# Patient Record
Sex: Female | Born: 1975 | Race: Black or African American | Hispanic: No | Marital: Single | Smoking: Current some day smoker
Health system: Southern US, Community
[De-identification: ages and names within clinical notes are randomized; demographics above are authoritative.]

## PROBLEM LIST (undated history)

## (undated) DIAGNOSIS — Z969 Presence of functional implant, unspecified: Secondary | ICD-10-CM

## (undated) DIAGNOSIS — I10 Essential (primary) hypertension: Secondary | ICD-10-CM

---

## 2017-03-14 ENCOUNTER — Emergency Department (HOSPITAL_BASED_OUTPATIENT_CLINIC_OR_DEPARTMENT_OTHER): Payer: Medicaid - Out of State

## 2017-03-14 ENCOUNTER — Other Ambulatory Visit: Payer: Self-pay

## 2017-03-14 ENCOUNTER — Encounter (HOSPITAL_BASED_OUTPATIENT_CLINIC_OR_DEPARTMENT_OTHER): Payer: Self-pay | Admitting: *Deleted

## 2017-03-14 ENCOUNTER — Emergency Department (HOSPITAL_BASED_OUTPATIENT_CLINIC_OR_DEPARTMENT_OTHER)
Admission: EM | Admit: 2017-03-14 | Discharge: 2017-03-14 | Disposition: A | Discharge status: Home / Self Care | Payer: Medicaid - Out of State | Attending: Emergency Medicine | Admitting: Emergency Medicine

## 2017-03-14 DIAGNOSIS — X58XXXA Exposure to other specified factors, initial encounter: Secondary | ICD-10-CM | POA: Insufficient documentation

## 2017-03-14 DIAGNOSIS — S99911A Unspecified injury of right ankle, initial encounter: Secondary | ICD-10-CM | POA: Diagnosis present

## 2017-03-14 DIAGNOSIS — Y92812 Truck as the place of occurrence of the external cause: Secondary | ICD-10-CM | POA: Insufficient documentation

## 2017-03-14 DIAGNOSIS — Y9389 Activity, other specified: Secondary | ICD-10-CM | POA: Insufficient documentation

## 2017-03-14 DIAGNOSIS — S8261XA Displaced fracture of lateral malleolus of right fibula, initial encounter for closed fracture: Secondary | ICD-10-CM | POA: Insufficient documentation

## 2017-03-14 DIAGNOSIS — S82891A Other fracture of right lower leg, initial encounter for closed fracture: Secondary | ICD-10-CM

## 2017-03-14 DIAGNOSIS — I10 Essential (primary) hypertension: Secondary | ICD-10-CM | POA: Insufficient documentation

## 2017-03-14 DIAGNOSIS — Y999 Unspecified external cause status: Secondary | ICD-10-CM | POA: Insufficient documentation

## 2017-03-14 HISTORY — DX: Essential (primary) hypertension: I10

## 2017-03-14 LAB — PREGNANCY, URINE: Preg Test, Ur: NEGATIVE

## 2017-03-14 MED ORDER — OXYCODONE-ACETAMINOPHEN 5-325 MG PO TABS: 1.0000 | ORAL_TABLET | Freq: Three times a day (TID) | ORAL | 0 refills | Status: DC | PRN

## 2017-03-14 MED ORDER — MORPHINE SULFATE (PF) 4 MG/ML IV SOLN
4.0000 mg | Freq: Once | INTRAVENOUS | Status: AC
  Administered 2017-03-14: 4 mg via INTRAVENOUS
  Filled 2017-03-14: qty 1

## 2017-03-14 MED ORDER — ONDANSETRON HCL 4 MG/2ML IJ SOLN
4.0000 mg | Freq: Once | INTRAMUSCULAR | Status: AC
  Administered 2017-03-14: 4 mg via INTRAVENOUS
  Filled 2017-03-14: qty 2

## 2017-03-14 MED ORDER — DOCUSATE SODIUM 100 MG PO CAPS: 100.0000 mg | ORAL_CAPSULE | Freq: Two times a day (BID) | ORAL | 0 refills | Status: DC

## 2017-03-14 MED ORDER — KETAMINE HCL 10 MG/ML IJ SOLN
90.0000 mg | Freq: Once | INTRAMUSCULAR | Status: DC
  Filled 2017-03-14: qty 1

## 2017-03-14 MED ORDER — FENTANYL CITRATE (PF) 100 MCG/2ML IJ SOLN
50.0000 ug | Freq: Once | INTRAMUSCULAR | Status: AC
Start: 2017-03-14 — End: 2017-03-14
  Administered 2017-03-14: 50 ug via INTRAVENOUS
  Filled 2017-03-14: qty 2

## 2017-03-14 MED ORDER — KETAMINE HCL 10 MG/ML IJ SOLN
INTRAMUSCULAR | Status: AC | PRN
  Administered 2017-03-14: 30 mg via INTRAVENOUS
  Administered 2017-03-14: 90 mg via INTRAVENOUS

## 2017-03-14 NOTE — ED Notes (Signed)
O2 sats noted to drop into the 80s (84-88%) with good pleth; periodically; resp even/unlabored and normal rate; O2 sats increase with repositioning of the hand that has pulse oximeter and encouraging pt to take deep breaths.

## 2017-03-14 NOTE — ED Provider Notes (Signed)
Emergency Department Provider Note   I have reviewed the triage vital signs and the nursing notes.   HISTORY  Chief Complaint ankle injury   HPI Kathy Hogan is a 41 y.o. female with past medical history of hypertension presents to the emergency department for evaluation of right ankle pain.  Patient states she was stepping down out of a truck when her ankle rolled to the side.  She had severe pain in that area and noticed immediate swelling.  No numbness or tingling.  She removed her shoe and her friends encouraged her to presents emergency department.  She has severe pain with attempting to bear weight on the foot.  No fall or head injury.  No knee pain, hip pain, pain in the arms or legs.  Pain is severe and worse with movement.  No radiation of symptoms.   Past Medical History:  Diagnosis Date  . Hypertension     There are no active problems to display for this patient.   Past Surgical History:  Procedure Laterality Date  . CESAREAN SECTION        Allergies Patient has no known allergies.  No family history on file.  Social History Social History   Tobacco Use  . Smoking status: Never Smoker  . Smokeless tobacco: Never Used  Substance Use Topics  . Alcohol use: Yes    Frequency: Never  . Drug use: No    Review of Systems  Constitutional: No fever/chills Eyes: No visual changes. ENT: No sore throat. Cardiovascular: Denies chest pain. Respiratory: Denies shortness of breath. Gastrointestinal: No abdominal pain.  No nausea, no vomiting.  No diarrhea.  No constipation. Genitourinary: Negative for dysuria. Musculoskeletal: Negative for back pain. Positive right ankle pain and swelling.  Skin: Negative for rash. Neurological: Negative for headaches, focal weakness or numbness.  10-point ROS otherwise negative.  ____________________________________________   PHYSICAL EXAM:  VITAL SIGNS: Vitals:   03/14/17 0630 03/14/17 0645  BP: 133/78 124/72    Pulse: 77 95  SpO2: 93% 93%    Constitutional: Alert and oriented. Well appearing and in no acute distress. Eyes: Conjunctivae are normal. Head: Atraumatic. Nose: No congestion/rhinnorhea. Mouth/Throat: Mucous membranes are moist.  Oropharynx non-erythematous. Neck: No stridor.   Cardiovascular: Normal rate, regular rhythm. Good peripheral circulation. Grossly normal heart sounds.   Respiratory: Normal respiratory effort.  No retractions. Lungs CTAB. Gastrointestinal: Soft and nontender. No distention.  Musculoskeletal: Right ankle swelling with tenderness to palpation over the medial malleolus. Some mild proximal fibula tenderness. No evidence of open fracture/dislocation. DP and PT pulses intact.  Neurologic:  Normal speech and language. No gross focal neurologic deficits are appreciated.  Skin:  Skin is warm, dry and intact. No rash noted.  ____________________________________________   LABS (all labs ordered are listed, but only abnormal results are displayed)  Labs Reviewed  PREGNANCY, URINE   ____________________________________________  RADIOLOGY  Dg Ankle Complete Right  Result Date: 03/14/2017 CLINICAL DATA:  41 y/o  F; 41 y/o  F; ankle injury with pain. EXAM: RIGHT ANKLE - COMPLETE 3+ VIEW COMPARISON:  None. FINDINGS: Acute oblique fibular fracture superior tibial plafond with mild displacement and comminution. Probable avulsion fracture of the posterior-lateral tibial tubercle. Marked widening of medial ankle mortise. Talar dome appears intact. Dorsal calcaneal bone spur. Extensive soft tissue swelling about the ankle. IMPRESSION: 1. Comminuted oblique mildly displaced acute distal fibular fracture superior to tibial plafond. 2. Probable acute avulsion fracture of posterolateral tibial tubercle. 3. Marked widening of medial ankle mortise.  Electronically Signed   By: Mitzi Hansen M.D.   On: 03/14/2017 05:30   CT ankle:   IMPRESSION:  1. Oblique,  comminuted, mildly displaced fracture of the distal  fibula, extending into the distal syndesmosis, with widening of the  syndesmosis and medial clear space. Varus angulation of the talus  with respect to the tibia.  2. Small ossific densities along the posterior malleolus may  represent tiny avulsion fractures.  3. Nondisplaced fracture through the base of the second metatarsal.      Electronically Signed  By: Obie Dredge M.D.  On: 03/14/2017 10:35   Plain film right knee:   FINDINGS:  Mild tricompartmental degenerative changes are noted most marked in  the medial joint space. Mild spurring is noted at the superior  insertion of the medial collateral ligament. No joint effusion is  seen. No acute fracture is noted.    IMPRESSION:  No acute abnormality noted.      Electronically Signed  By: Alcide Clever M.D.  On: 03/14/2017 08:10    ____________________________________________   PROCEDURES  Procedure(s) performed:   .Sedation Date/Time: 03/14/2017 10:50 AM Performed by: Maia Plan, MD Authorized by: Maia Plan, MD   Consent:    Consent obtained:  Written   Consent given by:  Patient   Risks discussed:  Allergic reaction, dysrhythmia, inadequate sedation, nausea, vomiting, respiratory compromise necessitating ventilatory assistance and intubation, prolonged sedation necessitating reversal and prolonged hypoxia resulting in organ damage   Alternatives discussed:  Regional anesthesia Universal protocol:    Procedure explained and questions answered to patient or proxy's satisfaction: yes     Immediately prior to procedure a time out was called: yes     Patient identity confirmation method:  Anonymous protocol, patient vented/unresponsive and arm band Indications:    Procedure performed:  Dislocation reduction   Procedure necessitating sedation performed by:  Physician performing sedation   Intended level of sedation:  Moderate (conscious  sedation) Pre-sedation assessment:    Time since last food or drink:  8 hours   ASA classification: class 1 - normal, healthy patient     Mallampati score:  I - soft palate, uvula, fauces, pillars visible   Pre-sedation assessments completed and reviewed: airway patency, cardiovascular function, hydration status, mental status, nausea/vomiting, pain level, respiratory function and temperature   Immediate pre-procedure details:    Reviewed: vital signs     Verified: bag valve mask available, emergency equipment available, intubation equipment available, IV patency confirmed, oxygen available and suction available   Procedure details (see MAR for exact dosages):    Preoxygenation:  Nasal cannula   Sedation:  Ketamine   Analgesia:  Morphine   Intra-procedure monitoring:  Blood pressure monitoring, continuous capnometry, frequent LOC assessments, frequent vital sign checks, continuous pulse oximetry and cardiac monitor   Intra-procedure events: none     Total Provider sedation time (minutes):  30 Post-procedure details:    Attendance: Constant attendance by certified staff until patient recovered     Recovery: Patient returned to pre-procedure baseline     Post-sedation assessments completed and reviewed: airway patency, cardiovascular function, hydration status, mental status, nausea/vomiting, pain level, respiratory function and temperature     Patient is stable for discharge or admission: yes     Patient tolerance:  Tolerated well, no immediate complications Reduction of dislocation Date/Time: 03/14/2017 10:53 AM Performed by: Maia Plan, MD Authorized by: Maia Plan, MD  Consent: Written consent obtained. Risks and benefits: risks, benefits and alternatives  were discussed Consent given by: patient Imaging studies: imaging studies available Required items: required blood products, implants, devices, and special equipment available Patient identity confirmed: verbally with  patient and arm band Time out: Immediately prior to procedure a "time out" was called to verify the correct patient, procedure, equipment, support staff and site/side marked as required. Local anesthesia used: no  Anesthesia: Local anesthesia used: no  Sedation: Patient sedated: yes Sedation type: moderate (conscious) sedation Sedatives: ketamine Analgesia: morphine Sedation start date/time: 03/14/2017 9:21 AM Sedation end date/time: 03/14/2017 9:51 AM Vitals: Vital signs were monitored during sedation.  Patient tolerance: Patient tolerated the procedure well with no immediate complications Comments: Lateral pressure applied to the talus and patient held by the big toe during splinting. Mold applied with palms to the ankle with slight ankle eversion.     ____________________________________________   INITIAL IMPRESSION / ASSESSMENT AND PLAN / ED COURSE  Pertinent labs & imaging results that were available during my care of the patient were reviewed by me and considered in my medical decision making (see chart for details).  Patient presents emergency department for evaluation of right ankle pain and swelling.  She has comminuted fracture of the fibula with avulsion injury of the tibial tubercle.  There is a wide ankle mortise on plain film.  Plan for closed reduction with conscious sedation.  Will discuss with orthopedics regarding follow-up timeframe.   Spoke with Dr. Roda ShuttersXu regarding the injury. Agrees with reduction and splinting. Keep patient NWB and provide pain control. Plan for surgery this week. Someone from their office will call this week to schedule surgery. Patient recovered from sedation without problem and tolerated PO prior to discharge. Patient provided ortho contact information. Called for ride home and discharged with good understanding of the plan and return precautions. Patient complaining of some tingling in the right foot. ACE wrap around splint adjusted and  tightness/tingling resolved.   At this time, I do not feel there is any life-threatening condition present. I have reviewed and discussed all results (EKG, imaging, lab, urine as appropriate), exam findings with patient. I have reviewed nursing notes and appropriate previous records.  I feel the patient is safe to be discharged home without further emergent workup. Discussed usual and customary return precautions. Patient and family (if present) verbalize understanding and are comfortable with this plan.  Patient will follow-up with their primary care provider. If they do not have a primary care provider, information for follow-up has been provided to them. All questions have been answered.  ____________________________________________  FINAL CLINICAL IMPRESSION(S) / ED DIAGNOSES  Final diagnoses:  Closed fracture of right ankle, initial encounter     MEDICATIONS GIVEN DURING THIS VISIT:  Medications  morphine 4 MG/ML injection 4 mg (not administered)  ketamine (KETALAR) injection 90 mg (not administered)  fentaNYL (SUBLIMAZE) injection 50 mcg (50 mcg Intravenous Given 03/14/17 0623)  ondansetron (ZOFRAN) injection 4 mg (4 mg Intravenous Given 03/14/17 0622)     NEW OUTPATIENT MEDICATIONS STARTED DURING THIS VISIT:  Percocet and Colace.   Note:  This document was prepared using Dragon voice recognition software and may include unintentional dictation errors.  Alona BeneJoshua Amil Bouwman, MD Emergency Medicine    Meerab Maselli, Arlyss RepressJoshua G, MD 03/15/17 [phone]

## 2017-03-14 NOTE — ED Notes (Signed)
Pt given some water to drink. Pt took a couple of sips and had no trouble swallowing.

## 2017-03-14 NOTE — ED Notes (Addendum)
Alert, NAD, calm, interactive, resps e/u, speaking in clear complete sentences, no dyspnea noted, skin W&D, admits to ETOH, generally "feel cold", c/o R ankle pain, numbness, tingling and swelling, (denies: sob, NV, dizziness or visual changes). Visitor to BS. Iced and elevated.

## 2017-03-14 NOTE — Discharge Instructions (Signed)
You were seen in the ED today after a fracture on the right ankle. This will require surgery and someone from Dr. Warren DanesXu's office will call you tomorrow to schedule that appointment. Keep the ankle elevated above the level of the heart at all times. Do not put any weight on the ankle. Return to the ED with any new injury or sudden worsening pain in the leg.   We also saw that you likely have sleep apnea while resting here in the ED. Call your PCP, or the number of the one provided, to schedule a sleep study and talk about treatment for this condition.

## 2017-03-14 NOTE — ED Notes (Signed)
Patient desating into the 80's while asleep on 4l/m O2.  Snoring respiration with total obstruction at times. MD aware

## 2017-03-14 NOTE — ED Notes (Signed)
Placed onto bedpan. Alert, NAD, calm, interactive, no changes.

## 2017-03-14 NOTE — ED Notes (Signed)
Talking on phone, calm, pleasant.

## 2017-03-14 NOTE — ED Triage Notes (Signed)
Pt drinking etoh. States she drank a lot but not sure of amount. [facility]. Swelling noted to her right ankle. States she was stepping down and fell. Denies loc. Denies any other injury

## 2017-03-15 ENCOUNTER — Other Ambulatory Visit: Payer: Self-pay

## 2017-03-15 ENCOUNTER — Encounter (HOSPITAL_BASED_OUTPATIENT_CLINIC_OR_DEPARTMENT_OTHER): Payer: Self-pay | Admitting: *Deleted

## 2017-03-17 ENCOUNTER — Encounter (HOSPITAL_BASED_OUTPATIENT_CLINIC_OR_DEPARTMENT_OTHER): Payer: Self-pay | Admitting: Anesthesiology

## 2017-03-17 ENCOUNTER — Ambulatory Visit (HOSPITAL_BASED_OUTPATIENT_CLINIC_OR_DEPARTMENT_OTHER): Payer: Medicaid - Out of State | Admitting: Anesthesiology

## 2017-03-17 ENCOUNTER — Ambulatory Visit (HOSPITAL_BASED_OUTPATIENT_CLINIC_OR_DEPARTMENT_OTHER)
Admission: RE | Admit: 2017-03-17 | Discharge: 2017-03-17 | Disposition: A | Discharge status: Home / Self Care | Payer: Medicaid - Out of State | Source: Ambulatory Visit | Attending: Orthopaedic Surgery | Admitting: Orthopaedic Surgery

## 2017-03-17 ENCOUNTER — Encounter (HOSPITAL_BASED_OUTPATIENT_CLINIC_OR_DEPARTMENT_OTHER)
Admission: RE | Disposition: A | Discharge status: Home / Self Care | Payer: Self-pay | Source: Ambulatory Visit | Attending: Orthopaedic Surgery

## 2017-03-17 ENCOUNTER — Other Ambulatory Visit: Payer: Self-pay

## 2017-03-17 ENCOUNTER — Ambulatory Visit (HOSPITAL_COMMUNITY): Payer: Medicaid - Out of State

## 2017-03-17 DIAGNOSIS — Z79899 Other long term (current) drug therapy: Secondary | ICD-10-CM | POA: Diagnosis not present

## 2017-03-17 DIAGNOSIS — I10 Essential (primary) hypertension: Secondary | ICD-10-CM | POA: Diagnosis not present

## 2017-03-17 DIAGNOSIS — Z419 Encounter for procedure for purposes other than remedying health state, unspecified: Secondary | ICD-10-CM

## 2017-03-17 DIAGNOSIS — S93431A Sprain of tibiofibular ligament of right ankle, initial encounter: Secondary | ICD-10-CM | POA: Diagnosis not present

## 2017-03-17 DIAGNOSIS — S82841A Displaced bimalleolar fracture of right lower leg, initial encounter for closed fracture: Secondary | ICD-10-CM | POA: Diagnosis not present

## 2017-03-17 HISTORY — PX: ORIF ANKLE FRACTURE: SHX5408

## 2017-03-17 SURGERY — OPEN REDUCTION INTERNAL FIXATION (ORIF) ANKLE FRACTURE
Anesthesia: General | Site: Ankle | Laterality: Right

## 2017-03-17 MED ORDER — ONDANSETRON HCL 4 MG/2ML IJ SOLN
INTRAMUSCULAR | Status: AC
  Filled 2017-03-17: qty 2

## 2017-03-17 MED ORDER — DEXAMETHASONE SODIUM PHOSPHATE 10 MG/ML IJ SOLN
INTRAMUSCULAR | Status: AC
  Filled 2017-03-17: qty 1

## 2017-03-17 MED ORDER — MIDAZOLAM HCL 2 MG/2ML IJ SOLN
1.0000 mg | INTRAMUSCULAR | Status: DC | PRN
  Administered 2017-03-17: 2 mg via INTRAVENOUS

## 2017-03-17 MED ORDER — OXYCODONE-ACETAMINOPHEN 5-325 MG PO TABS: 1.0000 | ORAL_TABLET | ORAL | 0 refills | Status: DC | PRN

## 2017-03-17 MED ORDER — LACTATED RINGERS IV SOLN
INTRAVENOUS | Status: DC
  Administered 2017-03-17: 09:00:00 via INTRAVENOUS

## 2017-03-17 MED ORDER — CHLORHEXIDINE GLUCONATE 4 % EX LIQD: 60.0000 mL | Freq: Once | CUTANEOUS | Status: DC

## 2017-03-17 MED ORDER — PROPOFOL 10 MG/ML IV BOLUS
INTRAVENOUS | Status: AC
  Filled 2017-03-17: qty 20

## 2017-03-17 MED ORDER — HYDROMORPHONE HCL 1 MG/ML IJ SOLN
0.2500 mg | INTRAMUSCULAR | Status: DC | PRN
  Administered 2017-03-17: 0.5 mg via INTRAVENOUS

## 2017-03-17 MED ORDER — ONDANSETRON HCL 4 MG PO TABS: 4.0000 mg | ORAL_TABLET | Freq: Three times a day (TID) | ORAL | 0 refills | Status: DC | PRN

## 2017-03-17 MED ORDER — HYDROMORPHONE HCL 1 MG/ML IJ SOLN
INTRAMUSCULAR | Status: AC
  Filled 2017-03-17: qty 0.5

## 2017-03-17 MED ORDER — LIDOCAINE HCL (CARDIAC) 20 MG/ML IV SOLN
INTRAVENOUS | Status: DC | PRN
  Administered 2017-03-17: 60 mg via INTRAVENOUS

## 2017-03-17 MED ORDER — PROMETHAZINE HCL 25 MG PO TABS: 25.0000 mg | ORAL_TABLET | Freq: Four times a day (QID) | ORAL | 1 refills | Status: DC | PRN

## 2017-03-17 MED ORDER — SCOPOLAMINE 1 MG/3DAYS TD PT72: 1.0000 | MEDICATED_PATCH | Freq: Once | TRANSDERMAL | Status: DC | PRN

## 2017-03-17 MED ORDER — ASPIRIN EC 325 MG PO TBEC: 325.0000 mg | DELAYED_RELEASE_TABLET | Freq: Two times a day (BID) | ORAL | 0 refills | Status: DC

## 2017-03-17 MED ORDER — MEPERIDINE HCL 25 MG/ML IJ SOLN: 6.2500 mg | INTRAMUSCULAR | Status: DC | PRN

## 2017-03-17 MED ORDER — ONDANSETRON HCL 4 MG/2ML IJ SOLN
INTRAMUSCULAR | Status: DC | PRN
  Administered 2017-03-17: 4 mg via INTRAVENOUS

## 2017-03-17 MED ORDER — OXYCODONE HCL 5 MG/5ML PO SOLN: 5.0000 mg | Freq: Once | ORAL | Status: AC | PRN

## 2017-03-17 MED ORDER — SENNOSIDES-DOCUSATE SODIUM 8.6-50 MG PO TABS: 1.0000 | ORAL_TABLET | Freq: Every evening | ORAL | 1 refills | Status: DC | PRN

## 2017-03-17 MED ORDER — FENTANYL CITRATE (PF) 100 MCG/2ML IJ SOLN
INTRAMUSCULAR | Status: AC
  Filled 2017-03-17: qty 2

## 2017-03-17 MED ORDER — OXYCODONE HCL 5 MG PO TABS
ORAL_TABLET | ORAL | Status: AC
  Filled 2017-03-17: qty 1

## 2017-03-17 MED ORDER — KETOROLAC TROMETHAMINE 30 MG/ML IJ SOLN: 30.0000 mg | Freq: Once | INTRAMUSCULAR | Status: DC | PRN

## 2017-03-17 MED ORDER — DEXTROSE 5 % IV SOLN
3.0000 g | INTRAVENOUS | Status: AC
  Administered 2017-03-17: 3 g via INTRAVENOUS

## 2017-03-17 MED ORDER — OXYCODONE HCL 5 MG PO TABS
5.0000 mg | ORAL_TABLET | Freq: Once | ORAL | Status: AC | PRN
  Administered 2017-03-17: 5 mg via ORAL

## 2017-03-17 MED ORDER — CEFAZOLIN SODIUM-DEXTROSE 1-4 GM/50ML-% IV SOLN
INTRAVENOUS | Status: AC
  Filled 2017-03-17: qty 50

## 2017-03-17 MED ORDER — MIDAZOLAM HCL 2 MG/2ML IJ SOLN
INTRAMUSCULAR | Status: AC
  Filled 2017-03-17: qty 2

## 2017-03-17 MED ORDER — PROMETHAZINE HCL 25 MG/ML IJ SOLN: 6.2500 mg | INTRAMUSCULAR | Status: DC | PRN

## 2017-03-17 MED ORDER — FENTANYL CITRATE (PF) 100 MCG/2ML IJ SOLN
50.0000 ug | INTRAMUSCULAR | Status: AC | PRN
  Administered 2017-03-17: 100 ug via INTRAVENOUS
  Administered 2017-03-17: 25 ug via INTRAVENOUS
  Administered 2017-03-17: 50 ug via INTRAVENOUS
  Administered 2017-03-17: 25 ug via INTRAVENOUS

## 2017-03-17 MED ORDER — CEFAZOLIN SODIUM-DEXTROSE 2-4 GM/100ML-% IV SOLN
INTRAVENOUS | Status: AC
  Filled 2017-03-17: qty 100

## 2017-03-17 MED ORDER — PROPOFOL 10 MG/ML IV BOLUS
INTRAVENOUS | Status: DC | PRN
  Administered 2017-03-17: 300 mg via INTRAVENOUS

## 2017-03-17 MED ORDER — LACTATED RINGERS IV SOLN
INTRAVENOUS | Status: DC
  Administered 2017-03-17 (×2): via INTRAVENOUS

## 2017-03-17 MED ORDER — DEXAMETHASONE SODIUM PHOSPHATE 10 MG/ML IJ SOLN
INTRAMUSCULAR | Status: DC | PRN
  Administered 2017-03-17: 10 mg via INTRAVENOUS

## 2017-03-17 MED ORDER — ROPIVACAINE HCL 7.5 MG/ML IJ SOLN
INTRAMUSCULAR | Status: DC | PRN
  Administered 2017-03-17: 30 mL via PERINEURAL

## 2017-03-17 MED ORDER — LIDOCAINE 2% (20 MG/ML) 5 ML SYRINGE
INTRAMUSCULAR | Status: AC
  Filled 2017-03-17: qty 5

## 2017-03-17 SURGICAL SUPPLY — 88 items
BANDAGE ACE 4X5 VEL STRL LF (GAUZE/BANDAGES/DRESSINGS) IMPLANT
BANDAGE ACE 6X5 VEL STRL LF (GAUZE/BANDAGES/DRESSINGS) ×2 IMPLANT
BANDAGE ESMARK 6X9 LF (GAUZE/BANDAGES/DRESSINGS) ×1 IMPLANT
BIT DRILL 3.5X122MM AO FIT (BIT) ×2 IMPLANT
BIT DRILL CANN 2.7 (BIT) ×1
BIT DRILL SRG 2.7XCANN AO CPLG (BIT) ×1 IMPLANT
BIT DRL SRG 2.7XCANN AO CPLNG (BIT) ×1
BLADE HEX COATED 2.75 (ELECTRODE) ×2 IMPLANT
BLADE SURG 15 STRL LF DISP TIS (BLADE) ×2 IMPLANT
BLADE SURG 15 STRL SS (BLADE) ×2
BNDG COHESIVE 6X5 TAN STRL LF (GAUZE/BANDAGES/DRESSINGS) ×2 IMPLANT
BNDG ESMARK 6X9 LF (GAUZE/BANDAGES/DRESSINGS) ×2
BRUSH SCRUB EZ PLAIN DRY (MISCELLANEOUS) ×2 IMPLANT
CANISTER SUCT 1200ML W/VALVE (MISCELLANEOUS) IMPLANT
COVER BACK TABLE 60X90IN (DRAPES) ×2 IMPLANT
COVER MAYO STAND STRL (DRAPES) IMPLANT
CUFF TOURNIQUET SINGLE 34IN LL (TOURNIQUET CUFF) ×2 IMPLANT
CUFF TOURNIQUET SINGLE 44IN (TOURNIQUET CUFF) IMPLANT
DECANTER SPIKE VIAL GLASS SM (MISCELLANEOUS) IMPLANT
DRAPE C-ARM 42X72 X-RAY (DRAPES) ×2 IMPLANT
DRAPE C-ARMOR (DRAPES) ×2 IMPLANT
DRAPE EXTREMITY T 121X128X90 (DRAPE) ×2 IMPLANT
DRAPE IMP U-DRAPE 54X76 (DRAPES) ×2 IMPLANT
DRAPE SURG 17X23 STRL (DRAPES) ×4 IMPLANT
DRILL 2.6X122MM WL AO SHAFT (BIT) ×2 IMPLANT
DRSG PAD ABDOMINAL 8X10 ST (GAUZE/BANDAGES/DRESSINGS) ×4 IMPLANT
DURAPREP 26ML APPLICATOR (WOUND CARE) ×4 IMPLANT
ELECT REM PT RETURN 9FT ADLT (ELECTROSURGICAL) ×2
ELECTRODE REM PT RTRN 9FT ADLT (ELECTROSURGICAL) ×1 IMPLANT
GAUZE SPONGE 4X4 12PLY STRL (GAUZE/BANDAGES/DRESSINGS) ×2 IMPLANT
GAUZE XEROFORM 1X8 LF (GAUZE/BANDAGES/DRESSINGS) ×2 IMPLANT
GLOVE BIO SURGEON STRL SZ 6.5 (GLOVE) ×2 IMPLANT
GLOVE BIOGEL PI IND STRL 7.0 (GLOVE) ×3 IMPLANT
GLOVE BIOGEL PI INDICATOR 7.0 (GLOVE) ×3
GLOVE ECLIPSE 7.0 STRL STRAW (GLOVE) ×2 IMPLANT
GLOVE SKINSENSE NS SZ7.5 (GLOVE) ×1
GLOVE SKINSENSE STRL SZ7.5 (GLOVE) ×1 IMPLANT
GLOVE SURG SYN 7.5  E (GLOVE) ×1
GLOVE SURG SYN 7.5 E (GLOVE) ×1 IMPLANT
GOWN STRL REIN XL XLG (GOWN DISPOSABLE) ×2 IMPLANT
GOWN STRL REUS W/ TWL LRG LVL3 (GOWN DISPOSABLE) ×1 IMPLANT
GOWN STRL REUS W/ TWL XL LVL3 (GOWN DISPOSABLE) ×1 IMPLANT
GOWN STRL REUS W/TWL LRG LVL3 (GOWN DISPOSABLE) ×1
GOWN STRL REUS W/TWL XL LVL3 (GOWN DISPOSABLE) ×1
K-WIRE ORTHOPEDIC 1.4X150L (WIRE) ×4
KWIRE ORTHOPEDIC 1.4X150L (WIRE) ×2 IMPLANT
MANIFOLD NEPTUNE II (INSTRUMENTS) ×2 IMPLANT
NEEDLE HYPO 22GX1.5 SAFETY (NEEDLE) IMPLANT
NS IRRIG 1000ML POUR BTL (IV SOLUTION) ×8 IMPLANT
PACK BASIN DAY SURGERY FS (CUSTOM PROCEDURE TRAY) ×2 IMPLANT
PAD CAST 3X4 CTTN HI CHSV (CAST SUPPLIES) IMPLANT
PAD CAST 4YDX4 CTTN HI CHSV (CAST SUPPLIES) ×2 IMPLANT
PADDING CAST COTTON 3X4 STRL (CAST SUPPLIES)
PADDING CAST COTTON 4X4 STRL (CAST SUPPLIES) ×2
PADDING CAST COTTON 6X4 STRL (CAST SUPPLIES) ×2 IMPLANT
PADDING CAST SYN 6 (CAST SUPPLIES)
PADDING CAST SYNTHETIC 4 (CAST SUPPLIES)
PADDING CAST SYNTHETIC 4X4 STR (CAST SUPPLIES) IMPLANT
PADDING CAST SYNTHETIC 6X4 NS (CAST SUPPLIES) IMPLANT
PENCIL BUTTON HOLSTER BLD 10FT (ELECTRODE) ×2 IMPLANT
PLATE DISTAL FIBULA 3HOLE (Plate) ×2 IMPLANT
SCREW BONE 14MMX3.5MM (Screw) ×4 IMPLANT
SCREW BONE 50MMX3.5MM (Screw) ×2 IMPLANT
SCREW BONE NON-LCKING 3.5X12MM (Screw) ×2 IMPLANT
SCREW LOCKING 3.5X16MM (Screw) ×2 IMPLANT
SCREW LOCKING 3.5X18MM (Screw) ×6 IMPLANT
SCREW NONLOCK 22MM (Screw) ×2 IMPLANT
SHEET MEDIUM DRAPE 40X70 STRL (DRAPES) IMPLANT
SLEEVE SCD COMPRESS KNEE MED (MISCELLANEOUS) ×2 IMPLANT
SPLINT FIBERGLASS 4X30 (CAST SUPPLIES) ×2 IMPLANT
SPONGE LAP 18X18 X RAY DECT (DISPOSABLE) ×2 IMPLANT
SUCTION FRAZIER HANDLE 10FR (MISCELLANEOUS) ×1
SUCTION TUBE FRAZIER 10FR DISP (MISCELLANEOUS) ×1 IMPLANT
SUT ETHILON 3 0 PS 1 (SUTURE) ×6 IMPLANT
SUT VIC AB 0 CT1 27 (SUTURE) ×1
SUT VIC AB 0 CT1 27XBRD ANBCTR (SUTURE) ×1 IMPLANT
SUT VIC AB 2-0 CT1 27 (SUTURE) ×2
SUT VIC AB 2-0 CT1 TAPERPNT 27 (SUTURE) ×2 IMPLANT
SUT VIC AB 3-0 SH 27 (SUTURE)
SUT VIC AB 3-0 SH 27X BRD (SUTURE) IMPLANT
SYR BULB 3OZ (MISCELLANEOUS) ×2 IMPLANT
SYR CONTROL 10ML LL (SYRINGE) IMPLANT
TOWEL OR 17X24 6PK STRL BLUE (TOWEL DISPOSABLE) ×2 IMPLANT
TOWEL OR NON WOVEN STRL DISP B (DISPOSABLE) ×2 IMPLANT
TRAY DSU PREP LF (CUSTOM PROCEDURE TRAY) IMPLANT
TUBE CONNECTING 20X1/4 (TUBING) ×2 IMPLANT
UNDERPAD 30X30 (UNDERPADS AND DIAPERS) ×2 IMPLANT
YANKAUER SUCT BULB TIP NO VENT (SUCTIONS) ×2 IMPLANT

## 2017-03-17 NOTE — H&P (Signed)
    PREOPERATIVE H&P  Chief Complaint: right bimalleolar ankle fracture  HPI: Kathy Hogan is a 42 y.o. female who presents for surgical treatment of right bimalleolar ankle fracture.  She denies any changes in medical history.  Past Medical History:  Diagnosis Date  . Bimalleolar ankle fracture 03/14/2017   right  . Hypertension    no current med.   Past Surgical History:  Procedure Laterality Date  . CESAREAN SECTION     x 2   Social History   Socioeconomic History  . Marital status: Single    Spouse name: None  . Number of children: None  . Years of education: None  . Highest education level: None  Social Needs  . Financial resource strain: None  . Food insecurity - worry: None  . Food insecurity - inability: None  . Transportation needs - medical: None  . Transportation needs - non-medical: None  Occupational History  . None  Tobacco Use  . Smoking status: Never Smoker  . Smokeless tobacco: Never Used  Substance and Sexual Activity  . Alcohol use: Yes    Frequency: Never    Comment: occasionally  . Drug use: No  . Sexual activity: None  Other Topics Concern  . None  Social History Narrative  . None   History reviewed. No pertinent family history. No Known Allergies Prior to Admission medications   Medication Sig Start Date End Date Taking? Authorizing Provider  oxyCODONE-acetaminophen (PERCOCET/ROXICET) 5-325 MG tablet Take 1 tablet by mouth every 8 (eight) hours as needed for severe pain. 03/14/17  Yes Long, Arlyss RepressJoshua G, MD  docusate sodium (COLACE) 100 MG capsule Take 1 capsule (100 mg total) by mouth every 12 (twelve) hours. 03/14/17   Long, Arlyss RepressJoshua G, MD     Positive ROS: All other systems have been reviewed and were otherwise negative with the exception of those mentioned in the HPI and as above.  Physical Exam: General: Alert, no acute distress Cardiovascular: No pedal edema Respiratory: No cyanosis, no use of accessory musculature GI: abdomen  soft Skin: No lesions in the area of chief complaint Neurologic: Sensation intact distally Psychiatric: Patient is competent for consent with normal mood and affect Lymphatic: no lymphedema  MUSCULOSKELETAL: exam stable  Assessment: right bimalleolar ankle fracture  Plan: Plan for Procedure(s): OPEN REDUCTION INTERNAL FIXATION (ORIF) RIGHT BIMALLEOLAR ANKLE FRACTURE  The risks benefits and alternatives were discussed with the patient including but not limited to the risks of nonoperative treatment, versus surgical intervention including infection, bleeding, nerve injury,  blood clots, cardiopulmonary complications, morbidity, mortality, among others, and they were willing to proceed.   Glee ArvinMichael Xu, MD   03/17/2017 8:25 AM

## 2017-03-17 NOTE — Anesthesia Preprocedure Evaluation (Signed)
Anesthesia Evaluation  Patient identified by MRN, date of birth, ID band Patient awake    Reviewed: Allergy & Precautions, NPO status , Patient's Chart, lab work & pertinent test results  Airway Mallampati: II  TM Distance: >3 FB Neck ROM: Full    Dental no notable dental hx.    Pulmonary neg pulmonary ROS,    Pulmonary exam normal breath sounds clear to auscultation       Cardiovascular hypertension, Pt. on medications Normal cardiovascular exam Rhythm:Regular Rate:Normal     Neuro/Psych negative neurological ROS  negative psych ROS   GI/Hepatic negative GI ROS, Neg liver ROS,   Endo/Other  Morbid obesity  Renal/GU negative Renal ROS     Musculoskeletal negative musculoskeletal ROS (+)   Abdominal   Peds  Hematology negative hematology ROS (+)   Anesthesia Other Findings   Reproductive/Obstetrics negative OB ROS                             Anesthesia Physical Anesthesia Plan  ASA: III  Anesthesia Plan: General   Post-op Pain Management: GA combined w/ Regional for post-op pain   Induction: Intravenous  PONV Risk Score and Plan: 3 and Ondansetron, Dexamethasone and Midazolam  Airway Management Planned: LMA  Additional Equipment:   Intra-op Plan:   Post-operative Plan: Extubation in OR  Informed Consent: I have reviewed the patients History and Physical, chart, labs and discussed the procedure including the risks, benefits and alternatives for the proposed anesthesia with the patient or authorized representative who has indicated his/her understanding and acceptance.   Dental advisory given  Plan Discussed with: CRNA  Anesthesia Plan Comments:         Anesthesia Quick Evaluation

## 2017-03-17 NOTE — Anesthesia Procedure Notes (Signed)
Procedure Name: LMA Insertion Date/Time: 03/17/2017 10:25 AM Performed by: Burna Cashonrad, Chauntae Hults C, CRNA Pre-anesthesia Checklist: Patient identified, Emergency Drugs available, Suction available and Patient being monitored Patient Re-evaluated:Patient Re-evaluated prior to induction Oxygen Delivery Method: Circle system utilized Preoxygenation: Pre-oxygenation with 100% oxygen Induction Type: IV induction Ventilation: Mask ventilation without difficulty LMA: LMA inserted LMA Size: 4.0 Number of attempts: 1 Airway Equipment and Method: Bite block Placement Confirmation: positive ETCO2 Tube secured with: Tape Dental Injury: Teeth and Oropharynx as per pre-operative assessment

## 2017-03-17 NOTE — Anesthesia Procedure Notes (Signed)
Anesthesia Regional Block: Popliteal block   Pre-Anesthetic Checklist: ,, timeout performed, Correct Patient, Correct Site, Correct Laterality, Correct Procedure, Correct Position, site marked, Risks and benefits discussed,  Surgical consent,  Pre-op evaluation,  At surgeon's request and post-op pain management  Laterality: Right  Prep: chloraprep       Needles:  Injection technique: Single-shot  Needle Type: Stimiplex     Needle Length: 10cm  Needle Gauge: 21     Additional Needles:   Procedures:,,,, ultrasound used (permanent image in chart),,,,  Motor weakness within 5 minutes.   Nerve Stimulator or Paresthesia:  Response: Plantar flexion/toe flexion, 0.5 mA,   Additional Responses:   Narrative:  Start time: 03/17/2017 9:30 AM End time: 03/17/2017 9:34 AM Injection made incrementally with aspirations every 5 mL.  Performed by: Personally  Anesthesiologist: Lewie LoronGermeroth, Kandace Elrod, MD  Additional Notes: Nerve located and needle positioned with direct ultrasound guidance. Good perineural spread. Patient tolerated well.

## 2017-03-17 NOTE — Progress Notes (Signed)
Assisted Dr. Germeroth with right, ultrasound guided, popliteal block. Side rails up, monitors on throughout procedure. See vital signs in flow sheet. Tolerated Procedure well. 

## 2017-03-17 NOTE — Discharge Instructions (Signed)
May take next dose of Oxycodone-acetaminophen (Percocet) at 5:00pm   Post Anesthesia Home Care Instructions  Activity: Get plenty of rest for the remainder of the day. A responsible individual must stay with you for 24 hours following the procedure.  For the next 24 hours, DO NOT: -Drive a car -Advertising copywriterperate machinery -Drink alcoholic beverages -Take any medication unless instructed by your physician -Make any legal decisions or sign important papers.  Meals: Start with liquid foods such as gelatin or soup. Progress to regular foods as tolerated. Avoid greasy, spicy, heavy foods. If nausea and/or vomiting occur, drink only clear liquids until the nausea and/or vomiting subsides. Call your physician if vomiting continues.  Special Instructions/Symptoms: Your throat may feel dry or sore from the anesthesia or the breathing tube placed in your throat during surgery. If this causes discomfort, gargle with warm salt water. The discomfort should disappear within 24 hours.  If you had a scopolamine patch placed behind your ear for the management of post- operative nausea and/or vomiting:  1. The medication in the patch is effective for 72 hours, after which it should be removed.  Wrap patch in a tissue and discard in the trash. Wash hands thoroughly with soap and water. 2. You may remove the patch earlier than 72 hours if you experience unpleasant side effects which may include dry mouth, dizziness or visual disturbances. 3. Avoid touching the patch. Wash your hands with soap and water after contact with the patch.     Regional Anesthesia Blocks  1. Numbness or the inability to move the "blocked" extremity may last from 3-48 hours after placement. The length of time depends on the medication injected and your individual response to the medication. If the numbness is not going away after 48 hours, call your surgeon.  2. The extremity that is blocked will need to be protected until the numbness is  gone and the  Strength has returned. Because you cannot feel it, you will need to take extra care to avoid injury. Because it may be weak, you may have difficulty moving it or using it. You may not know what position it is in without looking at it while the block is in effect.  3. For blocks in the legs and feet, returning to weight bearing and walking needs to be done carefully. You will need to wait until the numbness is entirely gone and the strength has returned. You should be able to move your leg and foot normally before you try and bear weight or walk. You will need someone to be with you when you first try to ensure you do not fall and possibly risk injury.  4. Bruising and tenderness at the needle site are common side effects and will resolve in a few days.  5. Persistent numbness or new problems with movement should be communicated to the surgeon or the [facility] [phone])/ [facility] [place] [phone]).      1. Keep splint clean and dry 2. Elevate foot above level of the heart 3. Take aspirin to prevent blood clots 4. Take pain meds as needed 5. Strict non weight bearing to operative extremity

## 2017-03-17 NOTE — Transfer of Care (Signed)
Immediate Anesthesia Transfer of Care Note  Patient: Kathy Hogan  Procedure(s) Performed: OPEN REDUCTION INTERNAL FIXATION (ORIF) RIGHT BIMALLEOLAR ANKLE FRACTURE (Right Ankle)  Patient Location: PACU  Anesthesia Type:GA combined with regional for post-op pain  Level of Consciousness: awake, alert  and oriented  Airway & Oxygen Therapy: Patient Spontanous Breathing and Patient connected to face mask oxygen  Post-op Assessment: Report given to RN and Post -op Vital signs reviewed and stable  Post vital signs: Reviewed and stable  Last Vitals:  Vitals:   03/17/17 0937 03/17/17 0938  BP:    Pulse: 68 71  Resp: (!) 21 (!) 21  Temp:    SpO2: 100% 100%    Last Pain:  Vitals:   03/17/17 0919  TempSrc: Oral  PainSc: 9       Patients Stated Pain Goal: 5 (03/17/17 0919)  Complications: No apparent anesthesia complications

## 2017-03-17 NOTE — Anesthesia Postprocedure Evaluation (Signed)
Anesthesia Post Note  Patient: Kathy Hogan  Procedure(s) Performed: OPEN REDUCTION INTERNAL FIXATION (ORIF) RIGHT BIMALLEOLAR ANKLE FRACTURE (Right Ankle)     Patient location during evaluation: PACU Anesthesia Type: General Level of consciousness: sedated and patient cooperative Pain management: pain level controlled Vital Signs Assessment: post-procedure vital signs reviewed and stable Respiratory status: spontaneous breathing Cardiovascular status: stable Anesthetic complications: no    Last Vitals:  Vitals:   03/17/17 1333 03/17/17 1345  BP: (!) 172/89   Pulse:    Resp: 18   Temp: 36.5 C   SpO2: 94% 94%    Last Pain:  Vitals:   03/17/17 1333  TempSrc:   PainSc: 6                  Lewie LoronJohn Jeffrie Stander

## 2017-03-17 NOTE — Op Note (Signed)
Date of Surgery: 03/17/2017  INDICATIONS: [name] is a 42 y.o.-year-old female who sustained a right ankle fracture; she was indicated for open reduction and internal fixation due to the displaced nature of the articular fracture and came to the operating room today for this procedure. The patient did consent to the procedure after discussion of the risks and benefits.  PREOPERATIVE DIAGNOSIS:  1. right bimalleolar ankle fracture 2. Right ankle syndesmosis rupture  POSTOPERATIVE DIAGNOSIS: Same.  PROCEDURE:  1. Open treatment of right ankle fracture with internal fixation.  Bimalleolar CPT [id number] 2.  Open treatment with internal fixation of right ankle syndesmosis rupture.  SURGEON: N. Glee ArvinMichael Xu, M.D.  ASSIST: Starlyn SkeansMary Lindsey LewisburgStanbery, [place]; necessary for the timely completion of procedure and due to complexity of procedure.  ANESTHESIA:  general, regional  TOURNIQUET TIME: less than 60 mins  IV FLUIDS AND URINE: See anesthesia.  ESTIMATED BLOOD LOSS: minimal mL.  IMPLANTS: [employer]  COMPLICATIONS: None.  DESCRIPTION OF PROCEDURE: The patient was brought to the operating room and placed supine on the operating table.  The patient had been signed prior to the procedure and this was documented. The patient had the anesthesia placed by the anesthesiologist.  A nonsterile tourniquet was placed on the upper thigh.  The prep verification and incision time-outs were performed to confirm that this was the correct patient, site, side and location. The patient had an SCD on the opposite lower extremity. The patient did receive antibiotics prior to the incision and was re-dosed during the procedure as needed at indicated intervals.  The patient had the lower extremity prepped and draped in the standard surgical fashion.  The extremity was exsanguinated using an esmarch bandage and the tourniquet was inflated to 300 mm Hg.  A lateral incision over the distal fibula was created.  Dissection  was carried down to the fibula.  Subperiosteal elevation was performed.  The fracture was exposed and the organized hematoma and entrapped periosteum were removed from the fracture site.  The fracture was then reduced and clamped in place.  Given the orientation of the fracture lag fixation was not possible.  We then placed a precontoured distal fibula plate using fluoroscopic guidance at the appropriate position.  Locking and nonlocking screws were placed through the plate into the fibula using standard AO technique.  Each screw had excellent purchase.  Distally we placed a unicortical locking screws to avoid joint penetration.  The clamp was then removed and the fracture remained reduced on orthogonal views.  A stress exam demonstrated widening of the medial clear space indicating an injury to the syndesmosis.  Therefore a large periarticular clamp was used to reduce the syndesmosis with the foot at 90 degrees of flexion.  Once the syndesmosis was reduced we then placed a single syndesmotic screw parallel to the joint using fluoroscopic guidance.  Final x-rays were then taken.  The wound was thoroughly irrigated and closed in layered fashion using 0 Vicryl, 2-0 Vicryl, 3-0 nylon.  Sterile dressings were applied.  Foot was placed in a short leg splint.  Patient tolerated procedure well and had no immediate complications.  POSTOPERATIVE PLAN: [name] will remain nonweightbearing on this leg for approximately 6 weeks; [name] will return for suture removal in 2 weeks.  He will be immobilized in a short leg splint and then transitioned to a CAM walker at his first follow up appointment.  [name] will receive DVT prophylaxis based on other medications, activity level, and risk ratio  of bleeding to thrombosis.  [name], MD [facility] [phone] 11:29 AM

## 2017-03-18 ENCOUNTER — Telehealth (INDEPENDENT_AMBULATORY_CARE_PROVIDER_SITE_OTHER): Payer: Self-pay | Admitting: Orthopaedic Surgery

## 2017-03-18 ENCOUNTER — Encounter (HOSPITAL_BASED_OUTPATIENT_CLINIC_OR_DEPARTMENT_OTHER): Payer: Self-pay | Admitting: Orthopaedic Surgery

## 2017-03-18 NOTE — Telephone Encounter (Signed)
Please advise 

## 2017-03-18 NOTE — Telephone Encounter (Signed)
Yes OOW up to 3 months

## 2017-03-18 NOTE — Telephone Encounter (Signed)
Patient called advised that she need a out of work note for her employer. Patient advised if the out of work note can be mailed to her. The number to contact patient is [phone]

## 2017-03-19 ENCOUNTER — Other Ambulatory Visit (INDEPENDENT_AMBULATORY_CARE_PROVIDER_SITE_OTHER): Payer: Self-pay

## 2017-03-19 NOTE — Telephone Encounter (Signed)
Note made.  

## 2017-03-19 NOTE — Telephone Encounter (Signed)
Will mail out today

## 2017-04-01 ENCOUNTER — Ambulatory Visit (INDEPENDENT_AMBULATORY_CARE_PROVIDER_SITE_OTHER): Payer: Medicaid - Out of State

## 2017-04-01 ENCOUNTER — Ambulatory Visit (INDEPENDENT_AMBULATORY_CARE_PROVIDER_SITE_OTHER): Payer: Medicaid - Out of State | Admitting: Orthopaedic Surgery

## 2017-04-01 DIAGNOSIS — S82841A Displaced bimalleolar fracture of right lower leg, initial encounter for closed fracture: Secondary | ICD-10-CM | POA: Diagnosis not present

## 2017-04-01 MED ORDER — OXYCODONE-ACETAMINOPHEN 5-325 MG PO TABS: 1.0000 | ORAL_TABLET | Freq: Four times a day (QID) | ORAL | 0 refills | Status: DC | PRN

## 2017-04-01 NOTE — Progress Notes (Signed)
   Post-Op Visit Note   Patient: Kathy Hogan           Date of Birth: 08/28/1975           MRN: [id number] Visit Date: 04/01/2017 PCP: Patient, No Pcp Per   Assessment & Plan:  Chief Complaint:  Chief Complaint  Patient presents with  . Right Ankle - Follow-up   Visit Diagnoses:  1. Bimalleolar ankle fracture, right, closed, initial encounter     Plan: Impression is ORIF right bimalleolar fracture with syndesmosis fixation.  At this point we are going to go ahead and remove bradycardia stitches.  We are going to place her in a fracture boot.  She will be nonweightbearing for the next 4 weeks.  She is to elevate as much as possible for swelling.  Follow-up with us in 4 weeks time for repeat evaluation and x-rays.  HPI: Kathy Hogan is a pleasant 42 year old female who presents to clinic today 2 weeks out ORIF right bimalleolar fracture and syndesmosis injury date of surgery 03/17/2017.  She has been in a fair amount of pain.  She has been elevating for swelling.  She has been compliant nonweightbearing.  No fevers chills or any other systemic symptoms.  PE: Well-developed well-nourished female in no acute distress.  Alert and oriented x3.  Examination of her right lower extremity reveals well-healing surgical incisions with nylon sutures intact.  Marked swelling to the right ankle.  No calf tenderness.  She is neurovascular intact distally.  Follow-Up Instructions: Return in about 4 weeks (around 04/29/2017).   Orders:  Orders Placed This Encounter  Procedures  . XR Ankle Complete Right   No orders of the defined types were placed in this encounter.   Imaging: No results found.  PMFS History: Patient Active Problem List   Diagnosis Date Noted  . Bimalleolar ankle fracture, right, closed, initial encounter 03/17/2017   Past Medical History:  Diagnosis Date  . Bimalleolar ankle fracture 03/14/2017   right  . Hypertension    no current med.    No family history on file.  Past  Surgical History:  Procedure Laterality Date  . CESAREAN SECTION     x 2  . ORIF ANKLE FRACTURE Right 03/17/2017   Procedure: OPEN REDUCTION INTERNAL FIXATION (ORIF) RIGHT BIMALLEOLAR ANKLE FRACTURE;  Surgeon: Tarry KosXu, Naiping M, MD;  Location: [facility];  Service: Orthopedics;  Laterality: Right;   Social History   Occupational History  . Not on file  Tobacco Use  . Smoking status: Never Smoker  . Smokeless tobacco: Never Used  Substance and Sexual Activity  . Alcohol use: Yes    Frequency: Never    Comment: occasionally  . Drug use: No  . Sexual activity: Not on file

## 2017-04-29 ENCOUNTER — Ambulatory Visit (INDEPENDENT_AMBULATORY_CARE_PROVIDER_SITE_OTHER): Payer: Medicaid - Out of State | Admitting: Orthopaedic Surgery

## 2017-04-29 ENCOUNTER — Encounter (INDEPENDENT_AMBULATORY_CARE_PROVIDER_SITE_OTHER): Payer: Self-pay | Admitting: Orthopaedic Surgery

## 2017-04-29 ENCOUNTER — Ambulatory Visit (INDEPENDENT_AMBULATORY_CARE_PROVIDER_SITE_OTHER): Payer: Medicaid - Out of State

## 2017-04-29 DIAGNOSIS — S82841A Displaced bimalleolar fracture of right lower leg, initial encounter for closed fracture: Secondary | ICD-10-CM

## 2017-04-29 NOTE — Progress Notes (Signed)
Patient is 6 weeks status post ORIF right ankle fracture with syndesmosis injury.  She is overall improving.  She is doing well.  The surgical scar is fully healed.  No signs of infection.  Foot is neurovascular intact.  X-rays show stable fixation and alignment.  At this point we will advance her to touchdown weightbearing with physical therapy for gait training.  Patient needs to remain out of work.  Follow-up in 4 weeks with three-view x-rays of the right ankle.  We discussed syndesmosis screw removal at 4 months.  Questions encouraged and answered.  Follow-up in 4 weeks.

## 2017-05-05 ENCOUNTER — Telehealth (INDEPENDENT_AMBULATORY_CARE_PROVIDER_SITE_OTHER): Payer: Self-pay | Admitting: Physician Assistant

## 2017-05-05 NOTE — Telephone Encounter (Signed)
Kathy Hogan, from [facility], called stating that the patient is at their facility and the PT cannot see the patient based on what is on the prescription.  They are needing the office notes from the last visit, operative notes, and if there is any restrictions.

## 2017-05-05 NOTE — Telephone Encounter (Signed)
faxed

## 2017-05-25 ENCOUNTER — Encounter (INDEPENDENT_AMBULATORY_CARE_PROVIDER_SITE_OTHER): Payer: Self-pay | Admitting: Orthopaedic Surgery

## 2017-05-25 ENCOUNTER — Ambulatory Visit (INDEPENDENT_AMBULATORY_CARE_PROVIDER_SITE_OTHER): Payer: Medicaid - Out of State

## 2017-05-25 ENCOUNTER — Ambulatory Visit (INDEPENDENT_AMBULATORY_CARE_PROVIDER_SITE_OTHER): Payer: Self-pay | Admitting: Orthopaedic Surgery

## 2017-05-25 VITALS — Ht 67.0 in | Wt 303.0 lb

## 2017-05-25 DIAGNOSIS — M25571 Pain in right ankle and joints of right foot: Secondary | ICD-10-CM | POA: Diagnosis not present

## 2017-05-25 DIAGNOSIS — S82841A Displaced bimalleolar fracture of right lower leg, initial encounter for closed fracture: Secondary | ICD-10-CM

## 2017-05-25 NOTE — Progress Notes (Signed)
Kathy Hogan is approximately 10 weeks status post ORIF right ankle fracture with syndesmosis injury.  She is overall doing well and progressing with physical therapy.  Her surgical scar is fully healed.  She has improved ankle range of motion.  He has minimal swelling.  X-rays show improvement in her fracture healing with stable fixation and alignment.  At this point we will going to continue to progress her with physical therapy.  Full weightbearing as tolerated.  Follow-up in 6 weeks with three-view x-rays of the right ankle.  We did discuss removal versus retention of the syndesmosis screw due to insurance issues since [place].

## 2017-06-15 ENCOUNTER — Ambulatory Visit (INDEPENDENT_AMBULATORY_CARE_PROVIDER_SITE_OTHER): Payer: Medicaid - Out of State | Admitting: Orthopaedic Surgery

## 2017-06-15 ENCOUNTER — Ambulatory Visit (INDEPENDENT_AMBULATORY_CARE_PROVIDER_SITE_OTHER): Payer: Medicaid - Out of State

## 2017-06-15 ENCOUNTER — Encounter (INDEPENDENT_AMBULATORY_CARE_PROVIDER_SITE_OTHER): Payer: Self-pay | Admitting: Orthopaedic Surgery

## 2017-06-15 DIAGNOSIS — S82841A Displaced bimalleolar fracture of right lower leg, initial encounter for closed fracture: Secondary | ICD-10-CM

## 2017-06-15 NOTE — Progress Notes (Signed)
   Post-Op Visit Note   Patient: Kathy Hogan           Date of Birth: 09/21/1975           MRN: [id number] Visit Date: 06/15/2017 PCP: Patient, No Pcp Per   Assessment & Plan:  Chief Complaint:  Chief Complaint  Patient presents with  . Right Ankle - Follow-up, Routine Post Op   Visit Diagnoses:  1. Bimalleolar ankle fracture, right, closed, initial encounter     Plan: Kathy Hogan is 90 days status post ORIF ankle fracture.  Overall she is doing well and progressing with physical therapy.  X-rays a healed fracture without any complications with the hardware.  At this point she can discontinue the cam walker.  We provided her with an ASO brace.  Continue with physical therapy.  Follow-up in a month with three-view x-rays of the right ankle.  We will at that time we will plan on scheduling her for the syndesmosis screw removal.  Follow-Up Instructions: Return in about 1 month (around 07/13/2017).   Orders:  Orders Placed This Encounter  Procedures  . XR Ankle Complete Right   No orders of the defined types were placed in this encounter.   Imaging: Xr Ankle Complete Right  Result Date: 06/15/2017 Healed ankle fracture with stable fixation.  No complications.   PMFS History: Patient Active Problem List   Diagnosis Date Noted  . Bimalleolar ankle fracture, right, closed, initial encounter 03/17/2017   Past Medical History:  Diagnosis Date  . Bimalleolar ankle fracture 03/14/2017   right  . Hypertension    no current med.    No family history on file.  Past Surgical History:  Procedure Laterality Date  . CESAREAN SECTION     x 2  . ORIF ANKLE FRACTURE Right 03/17/2017   Procedure: OPEN REDUCTION INTERNAL FIXATION (ORIF) RIGHT BIMALLEOLAR ANKLE FRACTURE;  Surgeon: Tarry KosXu, Shareeka Yim M, MD;  Location: [facility];  Service: Orthopedics;  Laterality: Right;   Social History   Occupational History  . Not on file  Tobacco Use  . Smoking status: Never Smoker  .  Smokeless tobacco: Never Used  Substance and Sexual Activity  . Alcohol use: Yes    Frequency: Never    Comment: occasionally  . Drug use: No  . Sexual activity: Not on file

## 2017-07-14 DIAGNOSIS — Z969 Presence of functional implant, unspecified: Secondary | ICD-10-CM

## 2017-07-14 HISTORY — DX: Presence of functional implant, unspecified: Z96.9

## 2017-07-15 ENCOUNTER — Ambulatory Visit (INDEPENDENT_AMBULATORY_CARE_PROVIDER_SITE_OTHER): Payer: PRIVATE HEALTH INSURANCE | Admitting: Orthopaedic Surgery

## 2017-07-15 ENCOUNTER — Ambulatory Visit (INDEPENDENT_AMBULATORY_CARE_PROVIDER_SITE_OTHER): Payer: PRIVATE HEALTH INSURANCE

## 2017-07-15 DIAGNOSIS — S82841D Displaced bimalleolar fracture of right lower leg, subsequent encounter for closed fracture with routine healing: Secondary | ICD-10-CM

## 2017-07-15 NOTE — Progress Notes (Signed)
   Office Visit Note   Patient: Kathy Hogan           Date of Birth: 10/15/1975           MRN: [id number] Visit Date: 07/15/2017              Requested by: No referring provider defined for this encounter. PCP: Patient, No Pcp Per   Assessment & Plan: Visit Diagnoses:  1. Closed bimalleolar fracture of right ankle with routine healing, subsequent encounter     Plan: At this point patient is 4 months status post syndesmosis repair.  Recommendation is for removal of the syndesmosis screw.  We will get her scheduled in the near future.  She understands the risks and benefits.  Follow-Up Instructions: Return for 2 week postop visit.   Orders:  Orders Placed This Encounter  Procedures  . XR Ankle Complete Right   No orders of the defined types were placed in this encounter.     Procedures: No procedures performed   Clinical Data: No additional findings.   Subjective: Chief Complaint  Patient presents with  . Right Ankle - Follow-up    Patient is 4 months status post ORIF right ankle and syndesmosis injury.  She comes in today she is doing well with no real complaints.   Review of Systems   Objective: Vital Signs: There were no vitals taken for this visit.  Physical Exam  Ortho Exam Surgical scar is fully healed.  Ankle range of motion strength are improving. Specialty Comments:  No specialty comments available.  Imaging: Xr Ankle Complete Right  Result Date: 07/15/2017 Healed ankle fracture.  Stable syndesmosis screw.    PMFS History: Patient Active Problem List   Diagnosis Date Noted  . Bimalleolar ankle fracture, right, closed, initial encounter 03/17/2017   Past Medical History:  Diagnosis Date  . Bimalleolar ankle fracture 03/14/2017   right  . Hypertension    no current med.    No family history on file.  Past Surgical History:  Procedure Laterality Date  . CESAREAN SECTION     x 2  . ORIF ANKLE FRACTURE Right 03/17/2017   Procedure:  OPEN REDUCTION INTERNAL FIXATION (ORIF) RIGHT BIMALLEOLAR ANKLE FRACTURE;  Surgeon: Tarry Kos, MD;  Location: [facility];  Service: Orthopedics;  Laterality: Right;   Social History   Occupational History  . Not on file  Tobacco Use  . Smoking status: Never Smoker  . Smokeless tobacco: Never Used  Substance and Sexual Activity  . Alcohol use: Yes    Frequency: Never    Comment: occasionally  . Drug use: No  . Sexual activity: Not on file

## 2017-08-12 ENCOUNTER — Other Ambulatory Visit: Payer: Self-pay

## 2017-08-12 ENCOUNTER — Other Ambulatory Visit (INDEPENDENT_AMBULATORY_CARE_PROVIDER_SITE_OTHER): Payer: Self-pay

## 2017-08-12 ENCOUNTER — Encounter (HOSPITAL_BASED_OUTPATIENT_CLINIC_OR_DEPARTMENT_OTHER): Payer: Self-pay | Admitting: *Deleted

## 2017-08-18 ENCOUNTER — Encounter (HOSPITAL_BASED_OUTPATIENT_CLINIC_OR_DEPARTMENT_OTHER)
Admission: RE | Disposition: A | Discharge status: Home / Self Care | Payer: Self-pay | Source: Ambulatory Visit | Attending: Orthopaedic Surgery

## 2017-08-18 ENCOUNTER — Ambulatory Visit (HOSPITAL_BASED_OUTPATIENT_CLINIC_OR_DEPARTMENT_OTHER): Payer: Medicaid - Out of State | Admitting: Anesthesiology

## 2017-08-18 ENCOUNTER — Other Ambulatory Visit: Payer: Self-pay

## 2017-08-18 ENCOUNTER — Encounter (HOSPITAL_BASED_OUTPATIENT_CLINIC_OR_DEPARTMENT_OTHER): Payer: Self-pay | Admitting: Anesthesiology

## 2017-08-18 ENCOUNTER — Ambulatory Visit (HOSPITAL_BASED_OUTPATIENT_CLINIC_OR_DEPARTMENT_OTHER)
Admission: RE | Admit: 2017-08-18 | Discharge: 2017-08-18 | Disposition: A | Discharge status: Home / Self Care | Payer: Medicaid - Out of State | Source: Ambulatory Visit | Attending: Orthopaedic Surgery | Admitting: Orthopaedic Surgery

## 2017-08-18 DIAGNOSIS — I10 Essential (primary) hypertension: Secondary | ICD-10-CM | POA: Diagnosis not present

## 2017-08-18 DIAGNOSIS — Y929 Unspecified place or not applicable: Secondary | ICD-10-CM | POA: Diagnosis not present

## 2017-08-18 DIAGNOSIS — Z79899 Other long term (current) drug therapy: Secondary | ICD-10-CM | POA: Insufficient documentation

## 2017-08-18 DIAGNOSIS — S82841D Displaced bimalleolar fracture of right lower leg, subsequent encounter for closed fracture with routine healing: Secondary | ICD-10-CM | POA: Diagnosis not present

## 2017-08-18 DIAGNOSIS — Z6841 Body Mass Index (BMI) 40.0 and over, adult: Secondary | ICD-10-CM | POA: Diagnosis not present

## 2017-08-18 DIAGNOSIS — Y798 Miscellaneous orthopedic devices associated with adverse incidents, not elsewhere classified: Secondary | ICD-10-CM | POA: Diagnosis not present

## 2017-08-18 DIAGNOSIS — T8489XA Other specified complication of internal orthopedic prosthetic devices, implants and grafts, initial encounter: Secondary | ICD-10-CM | POA: Insufficient documentation

## 2017-08-18 DIAGNOSIS — F17210 Nicotine dependence, cigarettes, uncomplicated: Secondary | ICD-10-CM | POA: Insufficient documentation

## 2017-08-18 HISTORY — PX: HARDWARE REMOVAL: SHX979

## 2017-08-18 HISTORY — DX: Presence of functional implant, unspecified: Z96.9

## 2017-08-18 LAB — POCT PREGNANCY, URINE: PREG TEST UR: NEGATIVE

## 2017-08-18 SURGERY — REMOVAL, HARDWARE
Anesthesia: General | Site: Ankle | Laterality: Right

## 2017-08-18 MED ORDER — FENTANYL CITRATE (PF) 100 MCG/2ML IJ SOLN
25.0000 ug | INTRAMUSCULAR | Status: DC | PRN
  Administered 2017-08-18: 50 ug via INTRAVENOUS

## 2017-08-18 MED ORDER — CHLORHEXIDINE GLUCONATE 4 % EX LIQD: 60.0000 mL | Freq: Once | CUTANEOUS | Status: DC

## 2017-08-18 MED ORDER — PROPOFOL 500 MG/50ML IV EMUL
INTRAVENOUS | Status: AC
  Filled 2017-08-18: qty 50

## 2017-08-18 MED ORDER — PROPOFOL 10 MG/ML IV BOLUS
INTRAVENOUS | Status: DC | PRN
  Administered 2017-08-18: 250 mg via INTRAVENOUS

## 2017-08-18 MED ORDER — EPINEPHRINE 30 MG/30ML IJ SOLN
INTRAMUSCULAR | Status: AC
  Filled 2017-08-18: qty 1

## 2017-08-18 MED ORDER — HYDROCODONE-ACETAMINOPHEN 5-325 MG PO TABS: 1.0000 | ORAL_TABLET | Freq: Four times a day (QID) | ORAL | 0 refills | Status: AC | PRN

## 2017-08-18 MED ORDER — KETOROLAC TROMETHAMINE 30 MG/ML IJ SOLN: 30.0000 mg | Freq: Once | INTRAMUSCULAR | Status: DC | PRN

## 2017-08-18 MED ORDER — MIDAZOLAM HCL 2 MG/2ML IJ SOLN
1.0000 mg | INTRAMUSCULAR | Status: DC | PRN
  Administered 2017-08-18: 2 mg via INTRAVENOUS

## 2017-08-18 MED ORDER — CEFAZOLIN SODIUM-DEXTROSE 2-4 GM/100ML-% IV SOLN
INTRAVENOUS | Status: AC
  Filled 2017-08-18: qty 100

## 2017-08-18 MED ORDER — FENTANYL CITRATE (PF) 100 MCG/2ML IJ SOLN
INTRAMUSCULAR | Status: AC
  Filled 2017-08-18: qty 2

## 2017-08-18 MED ORDER — DEXAMETHASONE SODIUM PHOSPHATE 10 MG/ML IJ SOLN
INTRAMUSCULAR | Status: DC | PRN
  Administered 2017-08-18: 10 mg via INTRAVENOUS

## 2017-08-18 MED ORDER — KETOROLAC TROMETHAMINE 30 MG/ML IJ SOLN
INTRAMUSCULAR | Status: DC | PRN
  Administered 2017-08-18: 30 mg via INTRAVENOUS

## 2017-08-18 MED ORDER — LACTATED RINGERS IV SOLN: INTRAVENOUS | Status: DC

## 2017-08-18 MED ORDER — DEXTROSE 5 % IV SOLN
3.0000 g | Freq: Once | INTRAVENOUS | Status: AC
  Administered 2017-08-18: 3 g via INTRAVENOUS

## 2017-08-18 MED ORDER — LIDOCAINE HCL (CARDIAC) PF 100 MG/5ML IV SOSY
PREFILLED_SYRINGE | INTRAVENOUS | Status: AC
Start: 2017-08-18 — End: ?
  Filled 2017-08-18: qty 5

## 2017-08-18 MED ORDER — BUPIVACAINE-EPINEPHRINE (PF) 0.25% -1:200000 IJ SOLN
INTRAMUSCULAR | Status: AC
Start: 2017-08-18 — End: ?
  Filled 2017-08-18: qty 60

## 2017-08-18 MED ORDER — HYDROCODONE-ACETAMINOPHEN 5-325 MG PO TABS
ORAL_TABLET | ORAL | Status: AC
  Filled 2017-08-18: qty 1

## 2017-08-18 MED ORDER — MIDAZOLAM HCL 2 MG/2ML IJ SOLN
INTRAMUSCULAR | Status: AC
  Filled 2017-08-18: qty 2

## 2017-08-18 MED ORDER — CEFAZOLIN SODIUM-DEXTROSE 1-4 GM/50ML-% IV SOLN
INTRAVENOUS | Status: AC
Start: 2017-08-18 — End: ?
  Filled 2017-08-18: qty 50

## 2017-08-18 MED ORDER — ONDANSETRON HCL 4 MG/2ML IJ SOLN
INTRAMUSCULAR | Status: AC
  Filled 2017-08-18: qty 2

## 2017-08-18 MED ORDER — BUPIVACAINE-EPINEPHRINE (PF) 0.5% -1:200000 IJ SOLN
INTRAMUSCULAR | Status: AC
  Filled 2017-08-18: qty 30

## 2017-08-18 MED ORDER — LACTATED RINGERS IV SOLN
INTRAVENOUS | Status: DC
  Administered 2017-08-18: 08:00:00 via INTRAVENOUS

## 2017-08-18 MED ORDER — ONDANSETRON HCL 4 MG/2ML IJ SOLN
INTRAMUSCULAR | Status: DC | PRN
  Administered 2017-08-18: 4 mg via INTRAVENOUS

## 2017-08-18 MED ORDER — HYDROCODONE-ACETAMINOPHEN 7.5-325 MG PO TABS
1.0000 | ORAL_TABLET | Freq: Once | ORAL | Status: AC | PRN
  Administered 2017-08-18: 1 via ORAL

## 2017-08-18 MED ORDER — DEXAMETHASONE SODIUM PHOSPHATE 10 MG/ML IJ SOLN
INTRAMUSCULAR | Status: AC
Start: 2017-08-18 — End: ?
  Filled 2017-08-18: qty 1

## 2017-08-18 MED ORDER — CEFAZOLIN SODIUM-DEXTROSE 2-4 GM/100ML-% IV SOLN: 2.0000 g | INTRAVENOUS | Status: DC

## 2017-08-18 MED ORDER — ONDANSETRON HCL 4 MG/2ML IJ SOLN: 4.0000 mg | Freq: Once | INTRAMUSCULAR | Status: DC | PRN

## 2017-08-18 MED ORDER — BUPIVACAINE HCL 0.25 % IJ SOLN
INTRAMUSCULAR | Status: DC | PRN
  Administered 2017-08-18: 10 mL

## 2017-08-18 MED ORDER — KETOROLAC TROMETHAMINE 30 MG/ML IJ SOLN
INTRAMUSCULAR | Status: AC
Start: 2017-08-18 — End: ?
  Filled 2017-08-18: qty 1

## 2017-08-18 MED ORDER — FENTANYL CITRATE (PF) 100 MCG/2ML IJ SOLN
50.0000 ug | INTRAMUSCULAR | Status: DC | PRN
  Administered 2017-08-18 (×2): 100 ug via INTRAVENOUS

## 2017-08-18 MED ORDER — SCOPOLAMINE 1 MG/3DAYS TD PT72: 1.0000 | MEDICATED_PATCH | Freq: Once | TRANSDERMAL | Status: DC | PRN

## 2017-08-18 MED ORDER — LIDOCAINE HCL (CARDIAC) PF 100 MG/5ML IV SOSY
PREFILLED_SYRINGE | INTRAVENOUS | Status: DC | PRN
  Administered 2017-08-18: 60 mg via INTRAVENOUS

## 2017-08-18 SURGICAL SUPPLY — 72 items
BANDAGE ACE 4X5 VEL STRL LF (GAUZE/BANDAGES/DRESSINGS) IMPLANT
BANDAGE ACE 6X5 VEL STRL LF (GAUZE/BANDAGES/DRESSINGS) ×3 IMPLANT
BANDAGE ESMARK 6X9 LF (GAUZE/BANDAGES/DRESSINGS) ×1 IMPLANT
BENZOIN TINCTURE PRP APPL 2/3 (GAUZE/BANDAGES/DRESSINGS) IMPLANT
BLADE HEX COATED 2.75 (ELECTRODE) ×3 IMPLANT
BLADE SURG 15 STRL LF DISP TIS (BLADE) ×2 IMPLANT
BLADE SURG 15 STRL SS (BLADE) ×4
BNDG COHESIVE 3X5 TAN STRL LF (GAUZE/BANDAGES/DRESSINGS) ×3 IMPLANT
BNDG ESMARK 6X9 LF (GAUZE/BANDAGES/DRESSINGS) ×3
CANISTER SUCT 1200ML W/VALVE (MISCELLANEOUS) ×3 IMPLANT
CLOSURE WOUND 1/2 X4 (GAUZE/BANDAGES/DRESSINGS)
COVER BACK TABLE 60X90IN (DRAPES) ×3 IMPLANT
CUFF TOURNIQUET SINGLE 24IN (TOURNIQUET CUFF) IMPLANT
CUFF TOURNIQUET SINGLE 34IN LL (TOURNIQUET CUFF) IMPLANT
DECANTER SPIKE VIAL GLASS SM (MISCELLANEOUS) IMPLANT
DRAPE EXTREMITY T 121X128X90 (DRAPE) ×3 IMPLANT
DRAPE IMP U-DRAPE 54X76 (DRAPES) ×3 IMPLANT
DRAPE OEC MINIVIEW 54X84 (DRAPES) ×3 IMPLANT
DRAPE U-SHAPE 47X51 STRL (DRAPES) ×3 IMPLANT
DURAPREP 26ML APPLICATOR (WOUND CARE) ×3 IMPLANT
ELECT REM PT RETURN 9FT ADLT (ELECTROSURGICAL) ×3
ELECTRODE REM PT RTRN 9FT ADLT (ELECTROSURGICAL) ×1 IMPLANT
GAUZE SPONGE 4X4 12PLY STRL (GAUZE/BANDAGES/DRESSINGS) ×3 IMPLANT
GAUZE SPONGE 4X4 16PLY XRAY LF (GAUZE/BANDAGES/DRESSINGS) IMPLANT
GAUZE XEROFORM 1X8 LF (GAUZE/BANDAGES/DRESSINGS) ×3 IMPLANT
GLOVE BIOGEL PI IND STRL 7.0 (GLOVE) ×1 IMPLANT
GLOVE BIOGEL PI INDICATOR 7.0 (GLOVE) ×2
GLOVE ECLIPSE 7.0 STRL STRAW (GLOVE) ×3 IMPLANT
GLOVE SKINSENSE NS SZ7.5 (GLOVE) ×2
GLOVE SKINSENSE STRL SZ7.5 (GLOVE) ×1 IMPLANT
GLOVE SURG SYN 7.5  E (GLOVE) ×2
GLOVE SURG SYN 7.5 E (GLOVE) ×1 IMPLANT
GOWN STRL REIN XL XLG (GOWN DISPOSABLE) ×3 IMPLANT
GOWN STRL REUS W/ TWL LRG LVL3 (GOWN DISPOSABLE) ×1 IMPLANT
GOWN STRL REUS W/ TWL XL LVL3 (GOWN DISPOSABLE) ×1 IMPLANT
GOWN STRL REUS W/TWL LRG LVL3 (GOWN DISPOSABLE) ×2
GOWN STRL REUS W/TWL XL LVL3 (GOWN DISPOSABLE) ×2
NEEDLE HYPO 22GX1.5 SAFETY (NEEDLE) ×3 IMPLANT
NS IRRIG 1000ML POUR BTL (IV SOLUTION) ×3 IMPLANT
PACK BASIN DAY SURGERY FS (CUSTOM PROCEDURE TRAY) ×3 IMPLANT
PAD CAST 3X4 CTTN HI CHSV (CAST SUPPLIES) IMPLANT
PAD CAST 4YDX4 CTTN HI CHSV (CAST SUPPLIES) IMPLANT
PADDING CAST COTTON 3X4 STRL (CAST SUPPLIES)
PADDING CAST COTTON 4X4 STRL (CAST SUPPLIES)
PADDING CAST COTTON 6X4 STRL (CAST SUPPLIES) IMPLANT
PADDING CAST SYN 6 (CAST SUPPLIES)
PADDING CAST SYNTHETIC 4 (CAST SUPPLIES)
PADDING CAST SYNTHETIC 4X4 STR (CAST SUPPLIES) IMPLANT
PADDING CAST SYNTHETIC 6X4 NS (CAST SUPPLIES) IMPLANT
PENCIL BUTTON HOLSTER BLD 10FT (ELECTRODE) ×3 IMPLANT
SLEEVE SCD COMPRESS KNEE MED (MISCELLANEOUS) ×3 IMPLANT
SPLINT FAST PLASTER 5X30 (CAST SUPPLIES)
SPLINT FIBERGLASS 3X35 (CAST SUPPLIES) IMPLANT
SPLINT FIBERGLASS 4X30 (CAST SUPPLIES) IMPLANT
SPLINT PLASTER CAST FAST 5X30 (CAST SUPPLIES) IMPLANT
SPONGE LAP 18X18 RF (DISPOSABLE) IMPLANT
STAPLER VISISTAT (STAPLE) IMPLANT
STRIP CLOSURE SKIN 1/2X4 (GAUZE/BANDAGES/DRESSINGS) IMPLANT
SUCTION FRAZIER HANDLE 10FR (MISCELLANEOUS) ×2
SUCTION TUBE FRAZIER 10FR DISP (MISCELLANEOUS) ×1 IMPLANT
SUT ETHILON 3 0 PS 1 (SUTURE) IMPLANT
SUT MNCRL AB 4-0 PS2 18 (SUTURE) IMPLANT
SUT VIC AB 2-0 CT1 27 (SUTURE)
SUT VIC AB 2-0 CT1 TAPERPNT 27 (SUTURE) IMPLANT
SYR BULB 3OZ (MISCELLANEOUS) ×3 IMPLANT
SYR CONTROL 10ML LL (SYRINGE) IMPLANT
TOWEL GREEN STERILE FF (TOWEL DISPOSABLE) ×3 IMPLANT
TOWEL OR NON WOVEN STRL DISP B (DISPOSABLE) ×3 IMPLANT
TUBE CONNECTING 20'X1/4 (TUBING) ×1
TUBE CONNECTING 20X1/4 (TUBING) ×2 IMPLANT
UNDERPAD 30X30 (UNDERPADS AND DIAPERS) ×3 IMPLANT
YANKAUER SUCT BULB TIP NO VENT (SUCTIONS) IMPLANT

## 2017-08-18 NOTE — Discharge Instructions (Signed)
Postoperative instructions: ° °Weightbearing instructions: as tolerated ° °Keep your dressing and/or splint clean and dry at all times.  You can remove your dressing on post-operative day #3 and change with a dry/sterile dressing or Band-Aids as needed thereafter.   ° °Incision instructions:  Do not soak your incision for 3 weeks after surgery.  If the incision gets wet, pat dry and do not scrub the incision. ° °Pain control:  You have been given a prescription to be taken as directed for post-operative pain control.  In addition, elevate the operative extremity above the heart at all times to prevent swelling and throbbing pain. ° °Take over-the-counter Colace, 100mg by mouth twice a day while taking narcotic pain medications to help prevent constipation. ° °Follow up appointments: °1) 10-14 days for suture removal and wound check. °2) Dr. Xu as scheduled. ° ° ------------------------------------------------------------------------------------------------------------- ° °After Surgery Pain Control: ° °After your surgery, post-surgical discomfort or pain is likely. This discomfort can last several days to a few weeks. At certain times of the day your discomfort may be more intense.  °Did you receive a nerve block?  °A nerve block can provide pain relief for one hour to two days after your surgery. As long as the nerve block is working, you will experience little or no sensation in the area the surgeon operated on.  °As the nerve block wears off, you will begin to experience pain or discomfort. It is very important that you begin taking your prescribed pain medication before the nerve block fully wears off. Treating your pain at the first sign of the block wearing off will ensure your pain is better controlled and more tolerable when full-sensation returns. Do not wait until the pain is intolerable, as the medicine will be less effective. It is better to treat pain in advance than to try and catch up.  °General  Anesthesia:  °If you did not receive a nerve block during your surgery, you will need to start taking your pain medication shortly after your surgery and should continue to do so as prescribed by your surgeon.  °Pain Medication:  °Most commonly we prescribe Vicodin and Percocet for post-operative pain. Both of these medications contain a combination of acetaminophen (Tylenol®) and a narcotic to help control pain.  °· It takes between 30 and 45 minutes before pain medication starts to work. It is important to take your medication before your pain level gets too intense.  °· Nausea is a common side effect of many pain medications. You will want to eat something before taking your pain medicine to help prevent nausea.  °· If you are taking a prescription pain medication that contains acetaminophen, we recommend that you do not take additional over the counter acetaminophen (Tylenol®).  °Other pain relieving options:  °· Using a cold pack to ice the affected area a few times a day (15 to 20 minutes at a time) can help to relieve pain, reduce swelling and bruising.  °· Elevation of the affected area can also help to reduce pain and swelling. ° ° ° ° ° °Post Anesthesia Home Care Instructions ° °Activity: °Get plenty of rest for the remainder of the day. A responsible individual must stay with you for 24 hours following the procedure.  °For the next 24 hours, DO NOT: °-Drive a car °-Operate machinery °-Drink alcoholic beverages °-Take any medication unless instructed by your physician °-Make any legal decisions or sign important papers. ° °Meals: °Start with liquid foods such as gelatin   or soup. Progress to regular foods as tolerated. Avoid greasy, spicy, heavy foods. If nausea and/or vomiting occur, drink only clear liquids until the nausea and/or vomiting subsides. Call your physician if vomiting continues. ° °Special Instructions/Symptoms: °Your throat may feel dry or sore from the anesthesia or the breathing tube  placed in your throat during surgery. If this causes discomfort, gargle with warm salt water. The discomfort should disappear within 24 hours. ° °If you had a scopolamine patch placed behind your ear for the management of post- operative nausea and/or vomiting: ° °1. The medication in the patch is effective for 72 hours, after which it should be removed.  Wrap patch in a tissue and discard in the trash. Wash hands thoroughly with soap and water. °2. You may remove the patch earlier than 72 hours if you experience unpleasant side effects which may include dry mouth, dizziness or visual disturbances. °3. Avoid touching the patch. Wash your hands with soap and water after contact with the patch. °  ° °

## 2017-08-18 NOTE — Anesthesia Preprocedure Evaluation (Signed)
Anesthesia Evaluation  Patient identified by MRN, date of birth, ID band Patient awake    Reviewed: Allergy & Precautions, NPO status , Patient's Chart, lab work & pertinent test results  Airway Mallampati: II  TM Distance: >3 FB Neck ROM: Full    Dental no notable dental hx. (+) Dental Advisory Given   Pulmonary neg pulmonary ROS, Current Smoker,    Pulmonary exam normal breath sounds clear to auscultation       Cardiovascular hypertension, Pt. on medications Normal cardiovascular exam Rhythm:Regular Rate:Normal     Neuro/Psych negative neurological ROS  negative psych ROS   GI/Hepatic negative GI ROS, Neg liver ROS,   Endo/Other  Morbid obesity  Renal/GU negative Renal ROS     Musculoskeletal negative musculoskeletal ROS (+)   Abdominal   Peds  Hematology negative hematology ROS (+)   Anesthesia Other Findings   Reproductive/Obstetrics negative OB ROS                            No results found for: WBC, HGB, HCT, MCV, PLT No results found for: CREATININE, BUN, NA, K, CL, CO2  Anesthesia Physical  Anesthesia Plan  ASA: III  Anesthesia Plan: General   Post-op Pain Management:    Induction: Intravenous  PONV Risk Score and Plan: 3 and Ondansetron, Dexamethasone and Midazolam  Airway Management Planned: LMA  Additional Equipment:   Intra-op Plan:   Post-operative Plan: Extubation in OR  Informed Consent: I have reviewed the patients History and Physical, chart, labs and discussed the procedure including the risks, benefits and alternatives for the proposed anesthesia with the patient or authorized representative who has indicated his/her understanding and acceptance.   Dental advisory given  Plan Discussed with: CRNA  Anesthesia Plan Comments:         Anesthesia Quick Evaluation

## 2017-08-18 NOTE — Transfer of Care (Signed)
Immediate Anesthesia Transfer of Care Note  Patient: Kathy Hogan  Procedure(s) Performed: HARDWARE REMOVAL RIGHT ANKLE (Right Ankle)  Patient Location: PACU  Anesthesia Type:General  Level of Consciousness: awake, alert  and oriented  Airway & Oxygen Therapy: Patient Spontanous Breathing and Patient connected to face mask oxygen  Post-op Assessment: Report given to RN and Post -op Vital signs reviewed and stable  Post vital signs: Reviewed and stable  Last Vitals:  Vitals Value Taken Time  BP    Temp    Pulse 83 08/18/2017  9:17 AM  Resp 19 08/18/2017  9:17 AM  SpO2 100 % 08/18/2017  9:17 AM  Vitals shown include unvalidated device data.  Last Pain:  Vitals:   08/18/17 0725  TempSrc: Oral  PainSc: 3       Patients Stated Pain Goal: 1 (08/18/17 0725)  Complications: No apparent anesthesia complications

## 2017-08-18 NOTE — Op Note (Signed)
   Date of Surgery: 08/18/2017  INDICATIONS: [name] is a 42 y.o.-year-old female with a right ankle syndesmotic fixation indicated for removal;  The patient did consent to the procedure after discussion of the risks and benefits.  PREOPERATIVE DIAGNOSIS: Right ankle syndesmotic fixation  POSTOPERATIVE DIAGNOSIS: Same.  PROCEDURE: Removal of right ankle syndesmosis screw, deep implant  SURGEON: N. Glee ArvinMichael Damien Cisar, M.D.  ASSIST: Starlyn SkeansMary Lindsey [place], [place]; necessary for the timely completion of procedure and due to complexity of procedure.  ANESTHESIA:  general  IV FLUIDS AND URINE: See anesthesia.  ESTIMATED BLOOD LOSS: minimal mL.  EXPLANTS: 1 syndesmosis screw  DRAINS: none  COMPLICATIONS: None.  DESCRIPTION OF PROCEDURE: The patient was brought to the operating room and placed supine on the operating table.  The patient had been signed prior to the procedure and this was documented. The patient had the anesthesia placed by the anesthesiologist.  A time-out was performed to confirm that this was the correct patient, site, side and location. The patient did receive antibiotics prior to the incision and was re-dosed during the procedure as needed at indicated intervals.  A tourniquet was placed.  The patient had the operative extremity prepped and draped in the standard surgical fashion.    A small incision was made over the lateral aspect of the ankle over the previous surgical scar.  Sharp dissection was carried down onto the fibula plate.  Soft tissue was then cleared off of the plate using an elevator.  The screw was identified using fluoroscopic guidance.  The screw was then backed out by hand.  Stress exam of the ankle showed no widening of the medial clear space.  The surgical wound was then thoroughly irrigated and closed in layered fashion using 2-0 Vicryl for the deep layer and 3-0 nylon for the skin.  Sterile dressings were applied.  Patient tolerated procedure well had no major  complications.  Local anesthesia was placed in the soft tissues.  POSTOPERATIVE PLAN: Patient will be discharged home.  Activity as tolerated.  Weight-bear as tolerated.  Mayra ReelN. Michael [name], MD [facility] [phone] 8:58 AM

## 2017-08-18 NOTE — Anesthesia Postprocedure Evaluation (Signed)
Anesthesia Post Note  Patient: Kathy Hogan  Procedure(s) Performed: HARDWARE REMOVAL RIGHT ANKLE (Right Ankle)     Patient location during evaluation: PACU Anesthesia Type: General Level of consciousness: awake and alert Pain management: pain level controlled Vital Signs Assessment: post-procedure vital signs reviewed and stable Respiratory status: spontaneous breathing, nonlabored ventilation, respiratory function stable and patient connected to nasal cannula oxygen Cardiovascular status: blood pressure returned to baseline and stable Postop Assessment: no apparent nausea or vomiting Anesthetic complications: no    Last Vitals:  Vitals:   08/18/17 0930 08/18/17 0945  BP: 109/61 (!) 127/96  Pulse: 64 69  Resp: 17 12  Temp:    SpO2: 95% 98%    Last Pain:  Vitals:   08/18/17 0945  TempSrc:   PainSc: 9                  Kennieth RadFitzgerald, Tesslyn Baumert E

## 2017-08-18 NOTE — H&P (Signed)
    PREOPERATIVE H&P  Chief Complaint: right ankle fracture with retained hardware  HPI: Kathy Hogan is a 42 y.o. female who presents for surgical treatment of right ankle fracture with retained hardware.  She denies any changes in medical history.  Past Medical History:  Diagnosis Date  . Hypertension    no current med.  . Retained orthopedic hardware 07/2017   right ankle   Past Surgical History:  Procedure Laterality Date  . CESAREAN SECTION     x 2  . ORIF ANKLE FRACTURE Right 03/17/2017   Procedure: OPEN REDUCTION INTERNAL FIXATION (ORIF) RIGHT BIMALLEOLAR ANKLE FRACTURE;  Surgeon: Tarry KosXu, Talullah Abate M, MD;  [facility];  Service: Orthopedics;  Laterality: Right;   Social History   Socioeconomic History  . Marital status: Single    Spouse name: Not on file  . Number of children: Not on file  . Years of education: Not on file  . Highest education level: Not on file  Occupational History  . Not on file  Social Needs  . Financial resource strain: Not on file  . Food insecurity:    Worry: Not on file    Inability: Not on file  . Transportation needs:    Medical: Not on file    Non-medical: Not on file  Tobacco Use  . Smoking status: Current Some Day Smoker    Years: 20.00    Types: Cigarettes  . Smokeless tobacco: Never Used  . Tobacco comment: 2 cig./week  Substance and Sexual Activity  . Alcohol use: Yes    Frequency: Never    Comment: occasionally  . Drug use: No  . Sexual activity: Not on file  Lifestyle  . Physical activity:    Days per week: Not on file    Minutes per session: Not on file  . Stress: Not on file  Relationships  . Social connections:    Talks on phone: Not on file    Gets together: Not on file    Attends religious service: Not on file    Active member of club or organization: Not on file    Attends meetings of clubs or organizations: Not on file    Relationship status: Not on file  Other Topics Concern  . Not on  file  Social History Narrative  . Not on file   History reviewed. No pertinent family history. No Known Allergies Prior to Admission medications   Not on File     Positive ROS: All other systems have been reviewed and were otherwise negative with the exception of those mentioned in the HPI and as above.  Physical Exam: General: Alert, no acute distress Cardiovascular: No pedal edema Respiratory: No cyanosis, no use of accessory musculature GI: abdomen soft Skin: No lesions in the area of chief complaint Neurologic: Sensation intact distally Psychiatric: Patient is competent for consent with normal mood and affect Lymphatic: no lymphedema  MUSCULOSKELETAL: exam stable  Assessment: right ankle fracture with retained hardware  Plan: Plan for Procedure(s): HARDWARE REMOVAL RIGHT ANKLE  The risks benefits and alternatives were discussed with the patient including but not limited to the risks of nonoperative treatment, versus surgical intervention including infection, bleeding, nerve injury,  blood clots, cardiopulmonary complications, morbidity, mortality, among others, and they were willing to proceed.   Glee ArvinMichael Nnenna Meador, MD   08/18/2017 8:39 AM

## 2017-08-18 NOTE — Anesthesia Procedure Notes (Signed)
Procedure Name: LMA Insertion Date/Time: 08/18/2017 8:39 AM Performed by: Burna Cashonrad, Depaul Arizpe C, CRNA Pre-anesthesia Checklist: Patient identified, Emergency Drugs available, Suction available and Patient being monitored Patient Re-evaluated:Patient Re-evaluated prior to induction Oxygen Delivery Method: Circle system utilized Preoxygenation: Pre-oxygenation with 100% oxygen Induction Type: IV induction Ventilation: Mask ventilation without difficulty LMA: LMA inserted LMA Size: 4.0 Number of attempts: 1 Airway Equipment and Method: Bite block Placement Confirmation: positive ETCO2 Tube secured with: Tape Dental Injury: Teeth and Oropharynx as per pre-operative assessment

## 2017-08-19 ENCOUNTER — Encounter (HOSPITAL_BASED_OUTPATIENT_CLINIC_OR_DEPARTMENT_OTHER): Payer: Self-pay | Admitting: Orthopaedic Surgery

## 2017-08-31 ENCOUNTER — Inpatient Hospital Stay (INDEPENDENT_AMBULATORY_CARE_PROVIDER_SITE_OTHER): Payer: PRIVATE HEALTH INSURANCE | Admitting: Orthopaedic Surgery

## 2018-08-17 IMAGING — DX DG ANKLE COMPLETE 3+V*R*
3 series · 3 of 3 positions shown · non-contrast
Comparison: None.

CLINICAL DATA: 41 y/o  F; 41 y/o  F; ankle injury with pain.

EXAM:
RIGHT ANKLE - COMPLETE 3+ VIEW

[ankle ap]
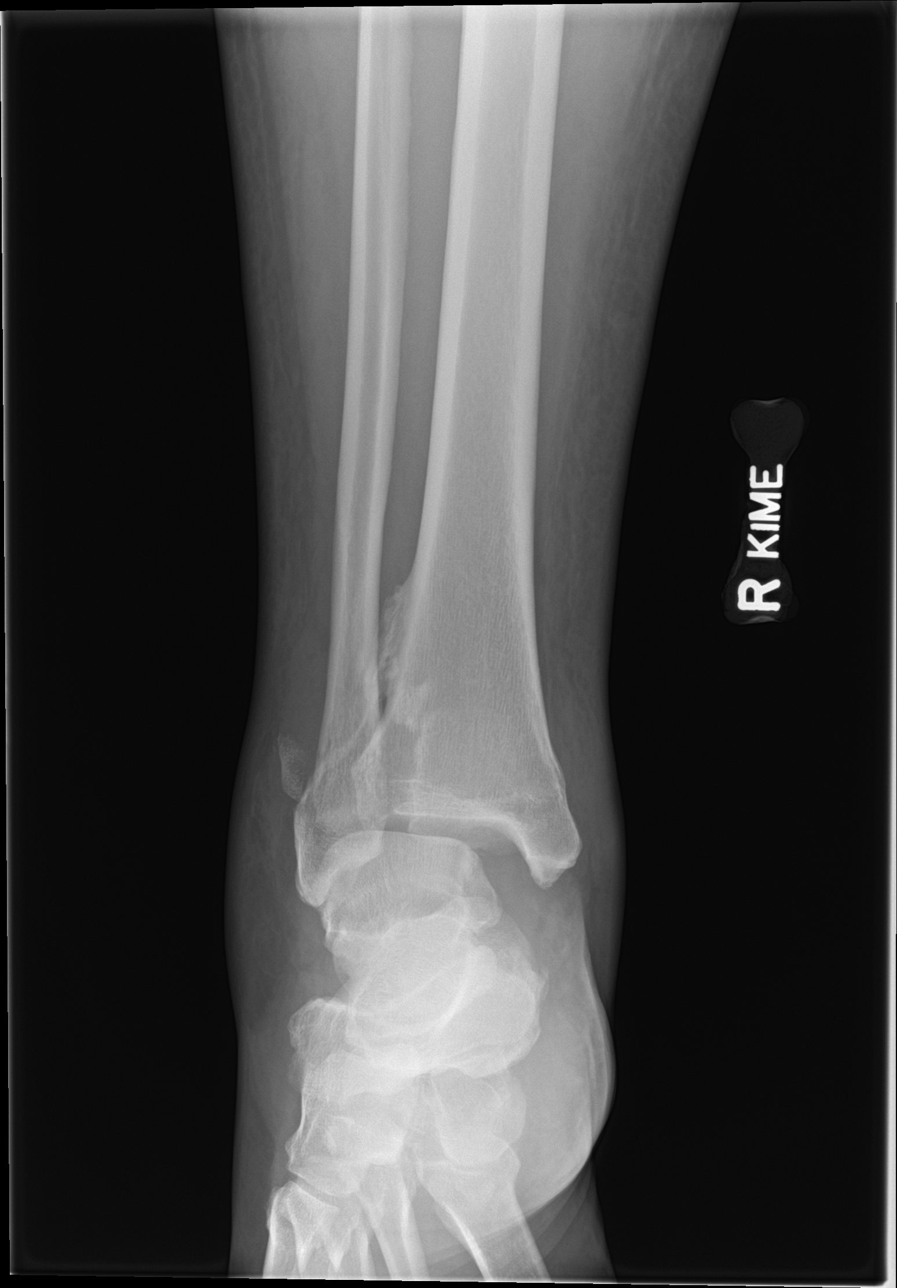

[ankle obl]
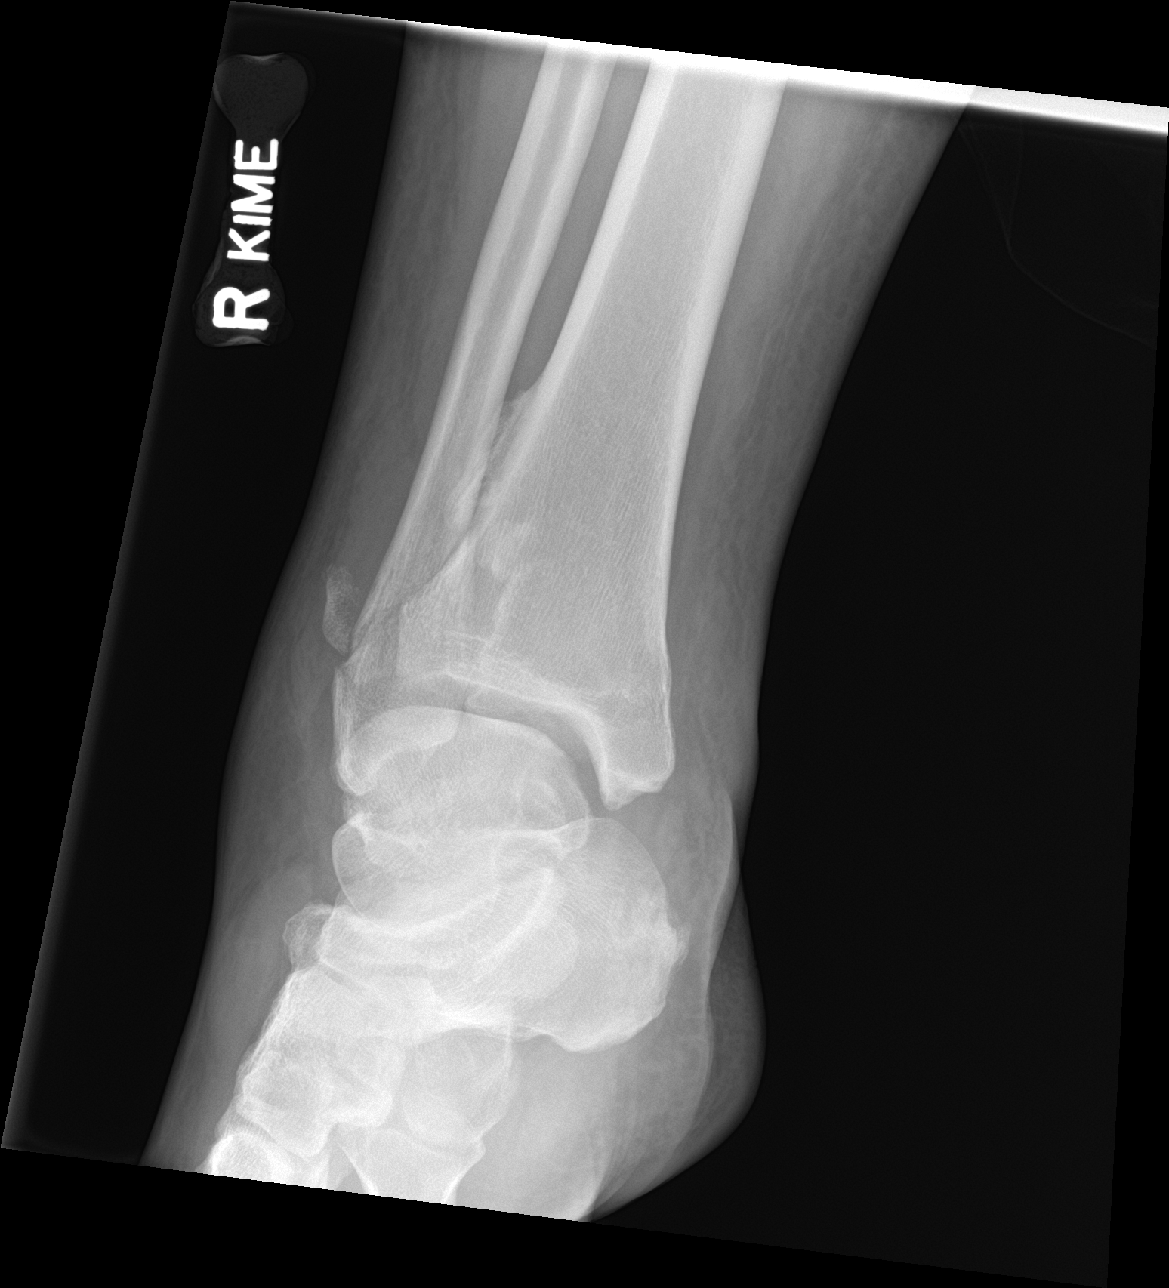

[ankle lat]
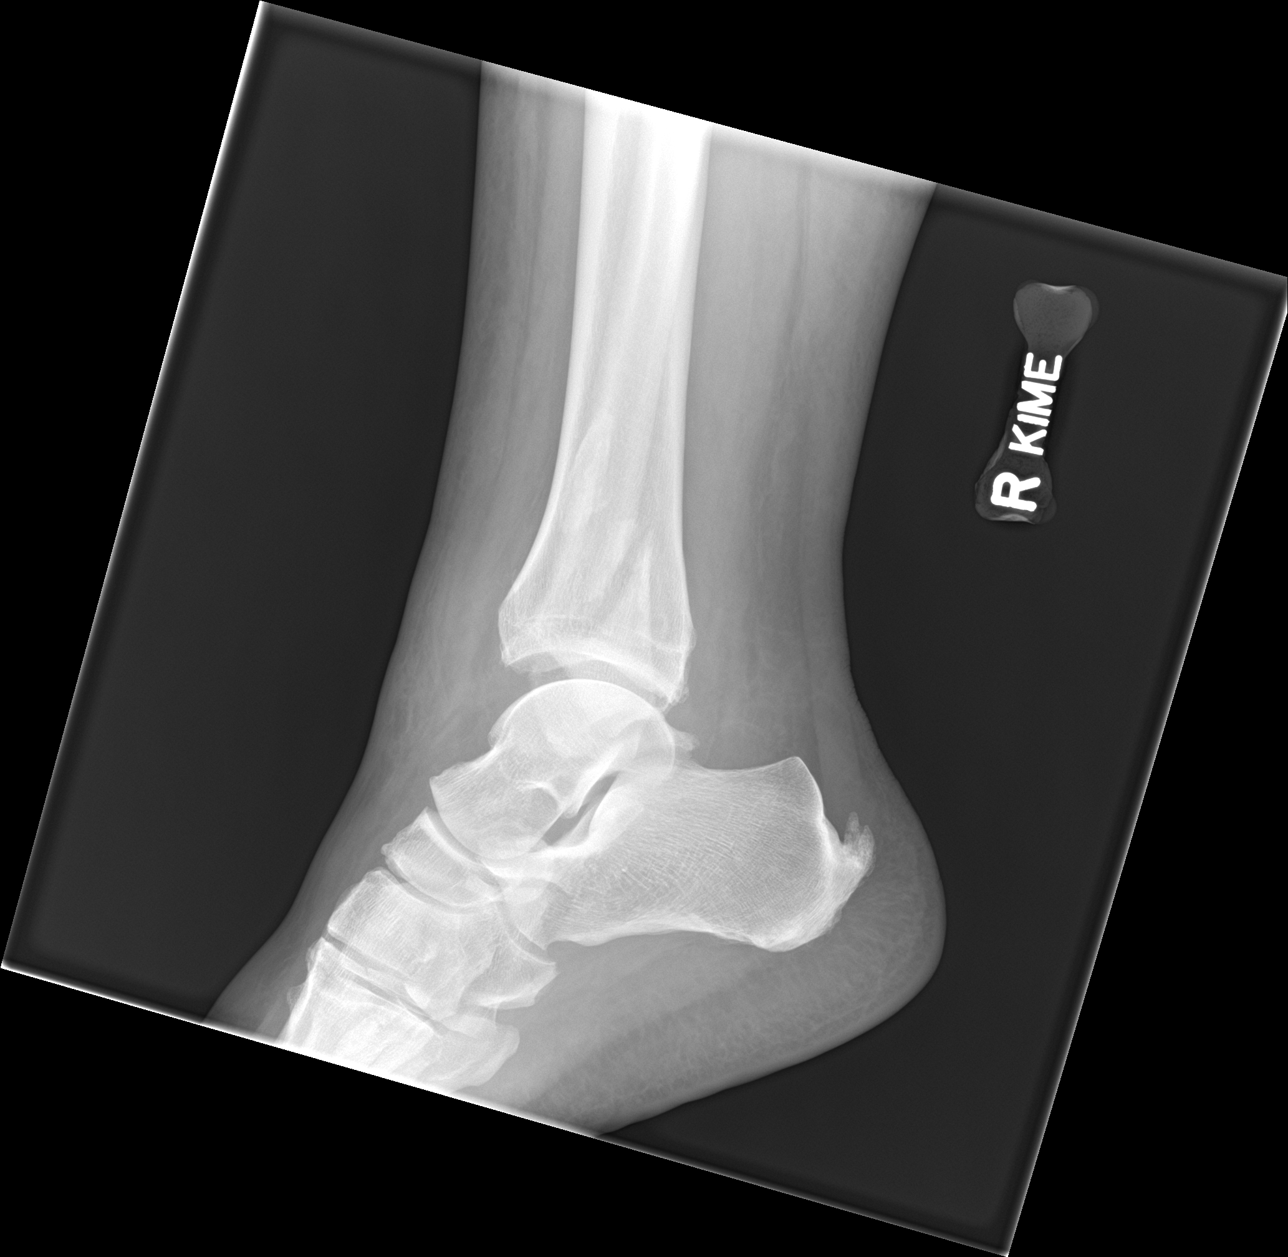

[3 of 3 positions shown; findings below may reference images not displayed]

FINDINGS: Acute oblique fibular fracture superior tibial plafond with mild
displacement and comminution. Probable avulsion fracture of the
posterior-lateral tibial tubercle. Marked widening of medial ankle
mortise. Talar dome appears intact. Dorsal calcaneal bone spur.
Extensive soft tissue swelling about the ankle.
IMPRESSION: 1. Comminuted oblique mildly displaced acute distal fibular fracture
superior to tibial plafond.
2. Probable acute avulsion fracture of posterolateral tibial
tubercle.
3. Marked widening of medial ankle mortise.

By: Lenin Nightingale M.D.

## 2018-08-20 IMAGING — RF DG ANKLE COMPLETE 3+V*R*
1 series · 3 of 3 positions shown · non-contrast
Comparison: Plain film of the right ankle dated 03/14/2017.

CLINICAL DATA: ORIF of the right ankle.

EXAM:
DG C-ARM 61-120 MIN; RIGHT ANKLE - COMPLETE 3+ VIEW

[Series 1: run · 3 of 3 slices shown]
[im 1/3]
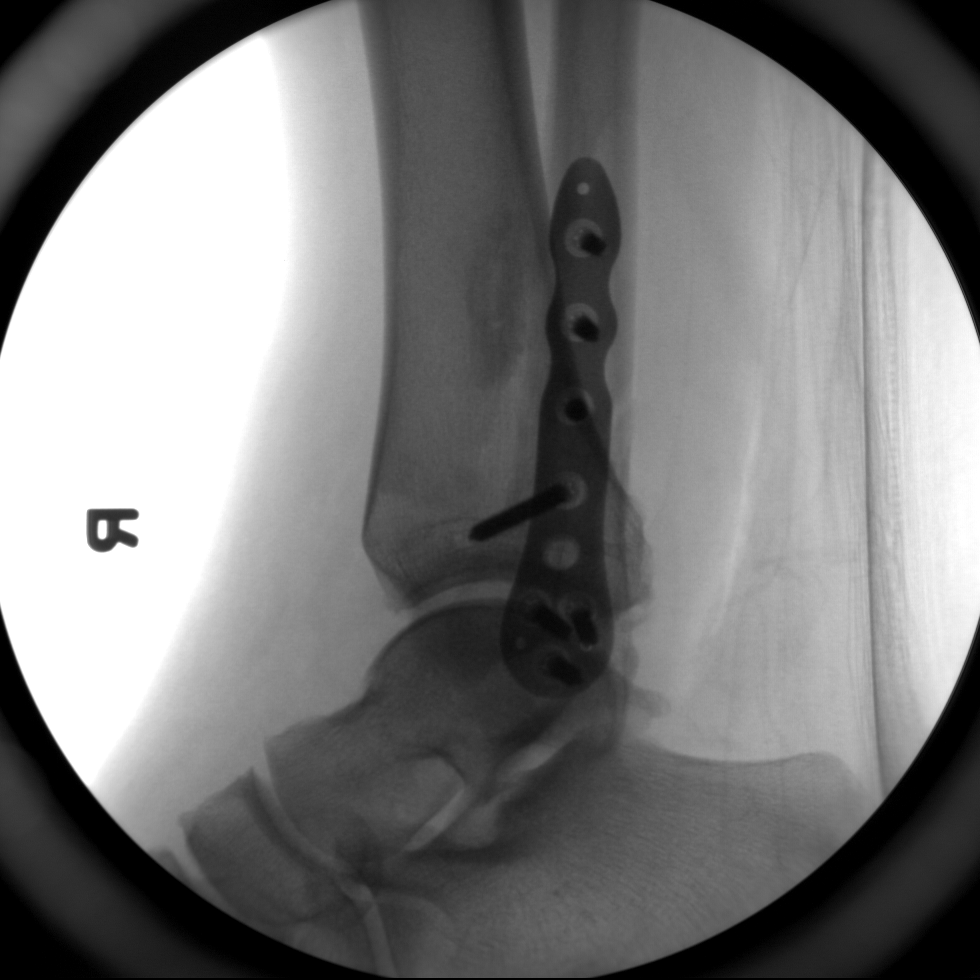
[im 2/3]
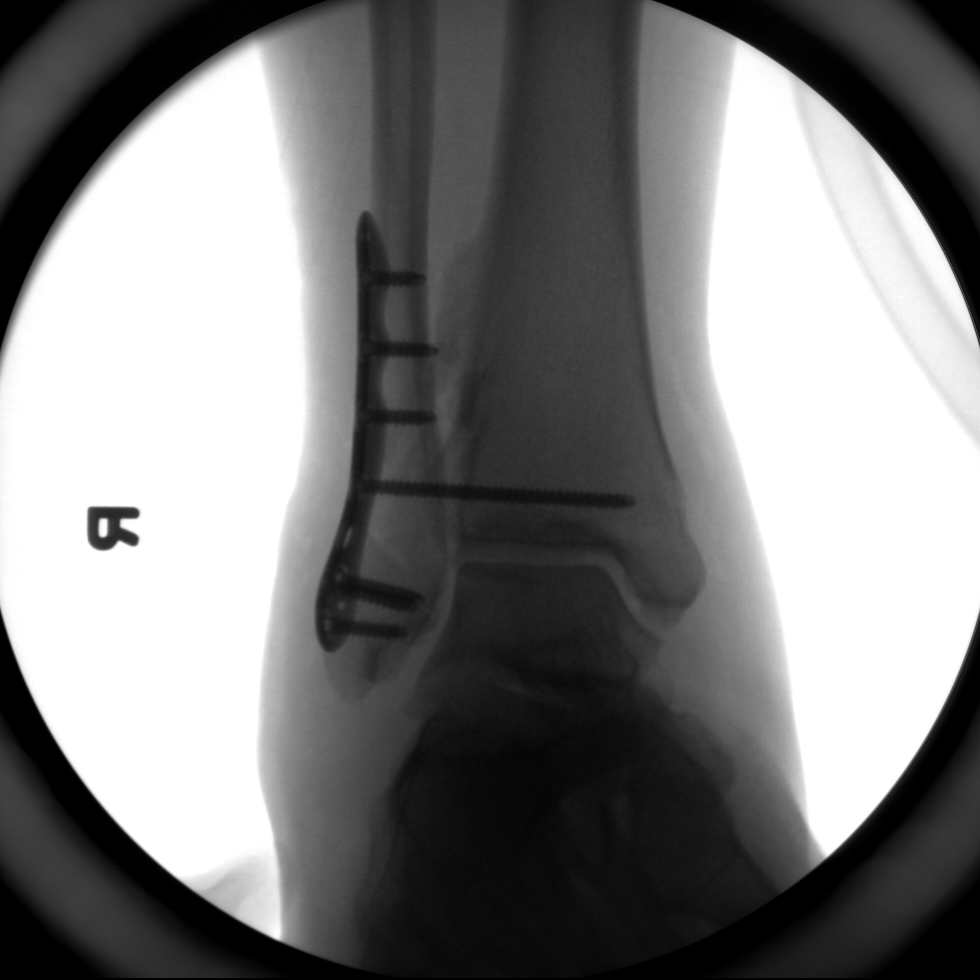
[im 3/3]
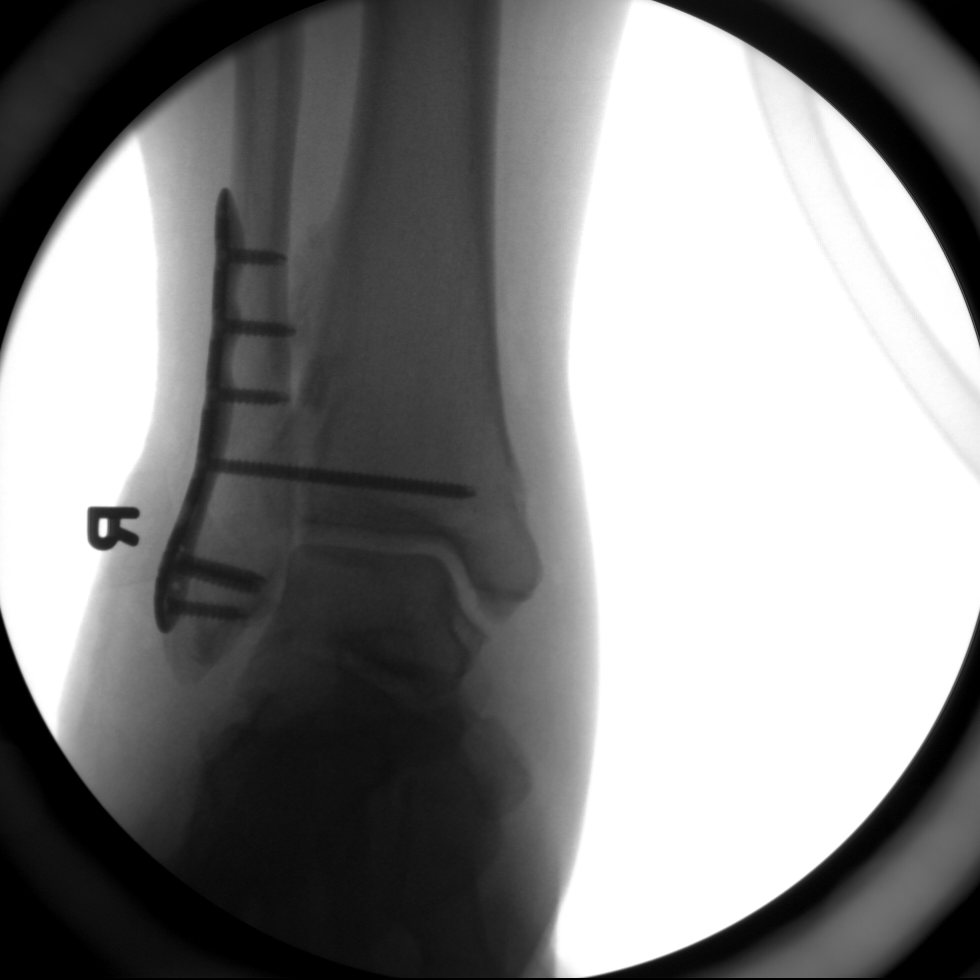

[3 of 3 positions shown; findings below may reference images not displayed]

FINDINGS: AP and lateral intraoperative fluoroscopic images are provided.
Interval plate and screw fixation of the distal fibula. Additional
fixation screw traversing the distal fibula and tibia. Osseous
alignment appears anatomic. Ankle mortise is symmetric.

Fluoroscopy was provided for 36 seconds.
IMPRESSION: Intraoperative images demonstrating plate and screw fixation of the
distal tib fib. Osseous alignment is significantly improved. No
evidence of surgical complicating feature.
